# Patient Record
Sex: Male | Born: 1955 | Race: White | Hispanic: Yes | Marital: Single | Smoking: Never smoker
Health system: Southern US, Community
[De-identification: ages and names within clinical notes are randomized; demographics above are authoritative.]

## PROBLEM LIST (undated history)

## (undated) DIAGNOSIS — I10 Essential (primary) hypertension: Secondary | ICD-10-CM

## (undated) HISTORY — DX: Essential (primary) hypertension: I10

---

## 2011-05-08 ENCOUNTER — Ambulatory Visit: Payer: Self-pay | Admitting: Physician Assistant

## 2011-05-08 VITALS — BP 136/82 | HR 79 | Temp 99.0°F | Resp 16 | Ht 71.5 in | Wt 192.0 lb

## 2011-05-08 DIAGNOSIS — R05 Cough: Secondary | ICD-10-CM

## 2011-05-08 DIAGNOSIS — J101 Influenza due to other identified influenza virus with other respiratory manifestations: Secondary | ICD-10-CM

## 2011-05-08 DIAGNOSIS — M791 Myalgia, unspecified site: Secondary | ICD-10-CM

## 2011-05-08 DIAGNOSIS — J029 Acute pharyngitis, unspecified: Secondary | ICD-10-CM

## 2011-05-08 DIAGNOSIS — IMO0001 Reserved for inherently not codable concepts without codable children: Secondary | ICD-10-CM

## 2011-05-08 DIAGNOSIS — R509 Fever, unspecified: Secondary | ICD-10-CM

## 2011-05-08 DIAGNOSIS — J111 Influenza due to unidentified influenza virus with other respiratory manifestations: Secondary | ICD-10-CM

## 2011-05-08 LAB — POCT INFLUENZA A/B: Influenza A, POC: NEGATIVE

## 2011-05-08 MED ORDER — IPRATROPIUM BROMIDE 0.03 % NA SOLN: 2.0000 | Freq: Two times a day (BID) | NASAL | Status: DC

## 2011-05-08 MED ORDER — HYDROCOD POLST-CHLORPHEN POLST 10-8 MG/5ML PO LQCR: 5.0000 mL | Freq: Two times a day (BID) | ORAL | Status: DC | PRN

## 2011-05-08 MED ORDER — OSELTAMIVIR PHOSPHATE 75 MG PO CAPS: 75.0000 mg | ORAL_CAPSULE | Freq: Two times a day (BID) | ORAL | Status: AC

## 2011-05-08 NOTE — Progress Notes (Signed)
  Subjective:    Patient ID: Nathaniel Hart, male    DOB: Jun 14, 1955, 56 y.o.   MRN: [id number]  HPI Presents for 3 days of illness.  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  Achey all over, sorethroat, cough, congestion, fever.   Review of Systems As above.    Objective:   Physical Exam  Vital signs noted. Well-developed, well nourished [place] who is awake, alert and oriented, in NAD. HEENT: [place]/AT, PERRL, EOMI.  Sclera and conjunctiva are clear.  EAC are patent, TMs are normal in appearance. Nasal mucosa is pink and moist. OP is clear. Neck: supple, non-tender, no lymphadenopathy, thyromegaly. Heart: RRR, no murmur Lungs: CTA Abdomen: normo-active bowel sounds, supple, non-tender, no mass or organomegaly. Extremities: no cyanosis, clubbing or edema. Skin: warm and dry without rash.  Results for orders placed in visit on 05/08/11  POCT INFLUENZA A/B      Component Value Range   Influenza A, POC Negative     Influenza B, POC Positive          Assessment & Plan:   1. Influenza B  oseltamivir (TAMIFLU) 75 MG capsule, ipratropium (ATROVENT) 0.03 % nasal spray, chlorpheniramine-HYDROcodone (TUSSIONEX PENNKINETIC ER) 10-8 MG/5ML LQCR  2. Fever  POCT Influenza A/B  3. Muscle pain    4. Cough    5. Acute pharyngitis     Supportive care. Re-evaluate if symptoms worsen/persist.

## 2011-05-08 NOTE — Patient Instructions (Signed)
Get lots of rest.  Drink at least 64 ounces of water daily.  Use acetaminophen and/or ibuprofen as needed for pain and fever.

## 2011-05-08 NOTE — Progress Notes (Signed)
Addended by: Denton Lank on: 05/08/2011 02:02 PM   Modules accepted: Orders

## 2012-06-22 ENCOUNTER — Ambulatory Visit: Payer: PRIVATE HEALTH INSURANCE | Admitting: Sports Medicine

## 2012-07-05 ENCOUNTER — Encounter: Payer: Self-pay | Admitting: Sports Medicine

## 2012-07-05 ENCOUNTER — Ambulatory Visit (INDEPENDENT_AMBULATORY_CARE_PROVIDER_SITE_OTHER): Payer: PRIVATE HEALTH INSURANCE | Admitting: Sports Medicine

## 2012-07-05 VITALS — BP 170/100 | HR 83 | Wt 194.0 lb

## 2012-07-05 DIAGNOSIS — Z299 Encounter for prophylactic measures, unspecified: Secondary | ICD-10-CM | POA: Insufficient documentation

## 2012-07-05 DIAGNOSIS — E785 Hyperlipidemia, unspecified: Secondary | ICD-10-CM

## 2012-07-05 DIAGNOSIS — B351 Tinea unguium: Secondary | ICD-10-CM

## 2012-07-05 DIAGNOSIS — I10 Essential (primary) hypertension: Secondary | ICD-10-CM

## 2012-07-05 DIAGNOSIS — Z23 Encounter for immunization: Secondary | ICD-10-CM

## 2012-07-05 MED ORDER — TERBINAFINE HCL 250 MG PO TABS: 250.0000 mg | ORAL_TABLET | Freq: Every day | ORAL | Status: AC

## 2012-07-05 MED ORDER — LISINOPRIL-HYDROCHLOROTHIAZIDE 20-25 MG PO TABS: 1.0000 | ORAL_TABLET | Freq: Every day | ORAL | Status: DC

## 2012-07-05 NOTE — Patient Instructions (Addendum)
Ringworm, Nail A fungal infection of the nail (tinea unguium/onychomycosis) is common. It is common as the visible part of the nail is composed of dead cells which have no blood supply to help prevent infection. It occurs because fungi are everywhere and will pick any opportunity to grow on any dead material. Because nails are very slow growing they require up to 2 years of treatment with anti-fungal medications. The entire nail back to the base is infected. This includes approximately  of the nail which you cannot see. If your caregiver has prescribed a medication by mouth, take it every day and as directed. No progress will be seen for at least 6 to 9 months. Do not be disappointed! Because fungi live on dead cells with little or no exposure to blood supply, medication delivery to the infection is slow; thus the cure is slow. It is also why you can observe no progress in the first 6 months. The nail becoming cured is the base of the nail, as it has the blood supply. Topical medication such as creams and ointments are usually not effective. Important in successful treatment of nail fungus is closely following the medication regimen that your doctor prescribes. Sometimes you and your caregiver may elect to speed up this process by surgical removal of all the nails. Even this may still require 6 to 9 months of additional oral medications. See your caregiver as directed. Remember there will be no visible improvement for at least 6 months. See your caregiver sooner if other signs of infection (redness and swelling) develop. Document Released: 01/24/2000 Document Revised: 04/20/2011 Document Reviewed: 04/03/2008 [facility] Patient Information 2014 [place], [place]. Tia de las uas (Ringworm, Nail) Usted presenta una infeccin por hongos en las uas de los pies. La parte visible de las uas est formada por clulas muertas que no tienen suministro sanguneo que intervenga en la prevencin de las infecciones. La  infeccin se produce debido a que los hongos estn en todas partes. Aprovecharn cualquier oportunidad para crecer Presenter, broadcasting. Esto incluye los tejidos de su cuerpo formados por [employer].  [place] uas tienen un crecimiento muy lento, requieren [place] 2 aos de tratamiento con medicamentos antimicticos. La infeccin involucra a toda la ua, hasta la base. Incluye aproximadamente 1/3 de la ua que no puede verse. Si el profesional le ha prescrito un medicamento por boca, [employer]. No podr ver ningn progreso hasta que hayan transcurrido entre 6 y 9 meses. No debe preocuparse. La curacin es lenta. Se debe a que el medicamento llega hasta la infeccin de manera muy lenta. Los hongos pueden vivir sobre las clulas muertas con poca o casi ninguna exposicin al suministro de [place]. Esta tambin es la razn por la cual no se observa mejora en los primeros 6 meses. La ua comienza la curacin en la base, donde hay suministro de [place]. La medicacin tpica, como las cremas y los ungentos generalmente no son eficaces. Channing Mutters al profesional podrn elegir acelerar el proceso de curacin con la extraccin quirrgica de todas las uas. An as, Pharmacist, hospital 6 y 9 meses de medicamentos por va oral adicionales. Concurra a la Training and development officer profesional que lo asiste de acuerdo a lo que le haya indicado. Recuerde que no observar mejora durante al menos 6 meses. Consulte antes con el profesional si aparecen otros signos de infeccin (p. ej. enrojecimiento e hinchazn). Document Released: 11/05/2004 Document Revised: 04/20/2011 [facility] Patient Information 2014 [place], [place].

## 2012-07-05 NOTE — Assessment & Plan Note (Signed)
Checking routine blood work. Tdap. GI referral for colonoscopy.

## 2012-07-05 NOTE — Assessment & Plan Note (Signed)
Starting lisinopril/hydrochlorothiazide. Return in 2-3 weeks to recheck.

## 2012-07-05 NOTE — Assessment & Plan Note (Signed)
Lamisil for 3 months. Checking liver function.

## 2012-07-05 NOTE — Progress Notes (Signed)
  Subjective:    CC: Establish care.   HPI:  This very pleasant 57 year old male has no significant past medical history, but seeks to establish care.  Toenail: Present for years, mildly itchy, toenails are somewhat dystrophic. Symptoms are moderate, stable.  Elevated blood pressure: No history of hypertension, no headaches, visual changes, chest pain.  Preventive measure: Due for all preventive measures for a man his age.  Past medical history, Surgical history, Family history not pertinant except as noted below, Social history, Allergies, and medications have been entered into the medical record, reviewed, and no changes needed.   Review of Systems: No headache, visual changes, nausea, vomiting, diarrhea, constipation, dizziness, abdominal pain, skin rash, fevers, chills, night sweats, swollen lymph nodes, weight loss, chest pain, body aches, joint swelling, muscle aches, shortness of breath, mood changes, visual or auditory hallucinations.  Objective:    General: Well Developed, well nourished, and in no acute distress.  Neuro: Alert and oriented x3, extra-ocular muscles intact, sensation grossly intact.  HEENT: Normocephalic, atraumatic, pupils equal round reactive to light, neck supple, no masses, no lymphadenopathy, thyroid nonpalpable.  Skin: Warm and dry, no rashes noted.  Cardiac: Regular rate and rhythm, no murmurs rubs or gallops.  Respiratory: Clear to auscultation bilaterally. Not using accessory muscles, speaking in full sentences.  Abdominal: Soft, nontender, nondistended, positive bowel sounds, no masses, no organomegaly.  Musculoskeletal: Shoulder, elbow, wrist, hip, knee, ankle stable, and with full range of motion.  There is onychomycosis with onychomycosis on the great nails of both feet. Impression and Recommendations:    The patient was counselled, risk factors were discussed, anticipatory guidance given.

## 2012-07-11 LAB — CBC
HCT: 41.9 % (ref 39.0–52.0)
Hemoglobin: 14.2 g/dL (ref 13.0–17.0)
MCH: 30 pg (ref 26.0–34.0)
MCHC: 33.9 g/dL (ref 30.0–36.0)
MCV: 88.4 fL (ref 78.0–100.0)
Platelets: 304 10*3/uL (ref 150–400)
RBC: 4.74 MIL/uL (ref 4.22–5.81)
RDW: 14.7 % (ref 11.5–15.5)
WBC: 8.7 K/uL (ref 4.0–10.5)

## 2012-07-11 LAB — COMPREHENSIVE METABOLIC PANEL
ALT: 21 U/L (ref 0–53)
AST: 19 U/L (ref 0–37)
Albumin: 4.1 g/dL (ref 3.5–5.2)
Alkaline Phosphatase: 58 U/L (ref 39–117)
Glucose, Bld: 102 mg/dL — ABNORMAL HIGH (ref 70–99)
Potassium: 4.2 mEq/L (ref 3.5–5.3)
Sodium: 139 mEq/L (ref 135–145)
Total Protein: 7.6 g/dL (ref 6.0–8.3)

## 2012-07-11 LAB — COMPREHENSIVE METABOLIC PANEL WITH GFR
BUN: 23 mg/dL (ref 6–23)
CO2: 27 meq/L (ref 19–32)
Calcium: 9 mg/dL (ref 8.4–10.5)
Chloride: 101 meq/L (ref 96–112)
Creat: 1.23 mg/dL (ref 0.50–1.35)
Total Bilirubin: 0.4 mg/dL (ref 0.3–1.2)

## 2012-07-11 LAB — HEMOGLOBIN A1C
Hgb A1c MFr Bld: 5.9 % — ABNORMAL HIGH (ref <5.7)
Mean Plasma Glucose: 123 mg/dL — ABNORMAL HIGH (ref <117)

## 2012-07-11 LAB — LIPID PANEL
Cholesterol: 181 mg/dL (ref 0–200)
HDL: 30 mg/dL — ABNORMAL LOW (ref >39)
LDL Cholesterol: 129 mg/dL — ABNORMAL HIGH (ref 0–99)
Total CHOL/HDL Ratio: 6 ratio
Triglycerides: 109 mg/dL (ref <150)
VLDL: 22 mg/dL (ref 0–40)

## 2012-07-11 LAB — TSH: TSH: 1.255 u[IU]/mL (ref 0.350–4.500)

## 2012-07-12 DIAGNOSIS — E785 Hyperlipidemia, unspecified: Secondary | ICD-10-CM | POA: Insufficient documentation

## 2012-07-12 LAB — VITAMIN D 25 HYDROXY (VIT D DEFICIENCY, FRACTURES): Vit D, 25-Hydroxy: 24 ng/mL — ABNORMAL LOW (ref 30–89)

## 2012-07-12 LAB — TESTOSTERONE, FREE, TOTAL, SHBG
Sex Hormone Binding: 24 nmol/L (ref 13–71)
Testosterone, Free: 88.5 pg/mL (ref 47.0–244.0)
Testosterone-% Free: 2.4 % (ref 1.6–2.9)
Testosterone: 370 ng/dL (ref 300–890)

## 2012-07-12 MED ORDER — VITAMIN D (ERGOCALCIFEROL) 1.25 MG (50000 UNIT) PO CAPS: 50000.0000 [IU] | ORAL_CAPSULE | ORAL | Status: DC

## 2012-07-12 NOTE — Addendum Note (Signed)
Addended by: Monica Becton on: 07/12/2012 12:55 PM   Modules accepted: Orders

## 2012-07-14 ENCOUNTER — Encounter: Payer: Self-pay | Admitting: Sports Medicine

## 2012-07-14 ENCOUNTER — Ambulatory Visit (INDEPENDENT_AMBULATORY_CARE_PROVIDER_SITE_OTHER): Payer: PRIVATE HEALTH INSURANCE | Admitting: Sports Medicine

## 2012-07-14 VITALS — BP 120/77 | HR 78

## 2012-07-14 DIAGNOSIS — R7303 Prediabetes: Secondary | ICD-10-CM | POA: Insufficient documentation

## 2012-07-14 DIAGNOSIS — E785 Hyperlipidemia, unspecified: Secondary | ICD-10-CM

## 2012-07-14 DIAGNOSIS — I10 Essential (primary) hypertension: Secondary | ICD-10-CM

## 2012-07-14 DIAGNOSIS — R7309 Other abnormal glucose: Secondary | ICD-10-CM

## 2012-07-14 NOTE — Patient Instructions (Addendum)
Exercise for 30 minutes 3 times per week, you need to get your heart rate up to 130 beats per minute.

## 2012-07-14 NOTE — Assessment & Plan Note (Signed)
We'll work on cutting back carbohydrates, recheck A1c in 3 months.

## 2012-07-14 NOTE — Progress Notes (Signed)
  Subjective:    CC: Followup  HPI: Prediabetes: Hemoglobin A1c was high, 5.9, he admits to eating lots of rice, breads, carbohydrates.  Hypertension: Was greatly elevated the last visit, he was having ringing in his ears and headaches, started lisinopril/hydrochlorothiazide, and it has become beautifully controlled, all symptoms have resolved.  Hyperlipidemia: Mild, desires to do lifestyle modification.  Onychomycosis: No problems with Lamisil.  Past medical history, Surgical history, Family history not pertinant except as noted below, Social history, Allergies, and medications have been entered into the medical record, reviewed, and no changes needed.   Review of Systems: No fevers, chills, night sweats, weight loss, chest pain, or shortness of breath.   Objective:    General: Well Developed, well nourished, and in no acute distress.  Neuro: Alert and oriented x3, extra-ocular muscles intact, sensation grossly intact.  HEENT: Normocephalic, atraumatic, pupils equal round reactive to light, neck supple, no masses, no lymphadenopathy, thyroid nonpalpable.  Skin: Warm and dry, no rashes. Cardiac: Regular rate and rhythm, no murmurs rubs or gallops, no lower extremity edema.  Respiratory: Clear to auscultation bilaterally. Not using accessory muscles, speaking in full sentences. Impression and Recommendations:

## 2012-07-14 NOTE — Assessment & Plan Note (Signed)
Will work on diet and exercise.

## 2012-07-14 NOTE — Assessment & Plan Note (Signed)
Very well controlled 

## 2012-07-21 ENCOUNTER — Ambulatory Visit: Payer: PRIVATE HEALTH INSURANCE | Admitting: Sports Medicine

## 2012-08-23 ENCOUNTER — Telehealth: Payer: Self-pay

## 2012-08-23 NOTE — Telephone Encounter (Signed)
Give it some time to let his body get used to the medicine, drink plenty of water, and let me know some of his blood pressure readings.

## 2012-08-23 NOTE — Telephone Encounter (Signed)
Pt called stated that his blood pressure medication is working good but his energy is down. He wants to know what he can do about it. Rhonda Cunningham,CMA

## 2012-08-24 ENCOUNTER — Telehealth: Payer: Self-pay

## 2012-08-24 NOTE — Telephone Encounter (Signed)
LMOM with instructions on continuing blood pressure medication, and for him to report some of his b/p readings as well. Shelsy Seng,CMA

## 2012-08-24 NOTE — Telephone Encounter (Addendum)
Pt states that he is not having any problems with his medications, He wants to know if you can write him a letter stating that there is no restrictions for him to wear a respirator for his job.  Rhonda Cunningham,CMA

## 2012-08-25 NOTE — Telephone Encounter (Signed)
Done

## 2012-09-27 ENCOUNTER — Telehealth: Payer: Self-pay | Admitting: Sports Medicine

## 2012-09-27 NOTE — Telephone Encounter (Signed)
I spoke to patient advised him that he has 3 Refills on the lisinopril. Also advised patient that he could take his Rx bottle to [facility] and have them to refill it for him. Rhonda Cunningham,CMA

## 2012-09-27 NOTE — Telephone Encounter (Signed)
Pt  Only has 4 pills left on bp meds.  He is working out of town and will not return until Sept 15th.   Please call in script to [facility] in [place], [place]..did not have address nor phone number available

## 2012-09-30 ENCOUNTER — Encounter: Payer: Self-pay | Admitting: Sports Medicine

## 2013-02-07 ENCOUNTER — Encounter: Payer: Self-pay | Admitting: Sports Medicine

## 2013-02-07 ENCOUNTER — Ambulatory Visit (INDEPENDENT_AMBULATORY_CARE_PROVIDER_SITE_OTHER): Payer: PRIVATE HEALTH INSURANCE | Admitting: Sports Medicine

## 2013-02-07 VITALS — BP 131/83 | HR 104 | Wt 193.0 lb

## 2013-02-07 DIAGNOSIS — IMO0002 Reserved for concepts with insufficient information to code with codable children: Secondary | ICD-10-CM | POA: Insufficient documentation

## 2013-02-07 DIAGNOSIS — H8110 Benign paroxysmal vertigo, unspecified ear: Secondary | ICD-10-CM

## 2013-02-07 MED ORDER — DEXAMETHASONE SODIUM PHOSPHATE 4 MG/ML IJ SOLN
4.0000 mg | Freq: Once | INTRAMUSCULAR | Status: AC
  Administered 2013-02-07: 4 mg via INTRAMUSCULAR

## 2013-02-07 MED ORDER — PREDNISONE 50 MG PO TABS: 50.0000 mg | ORAL_TABLET | Freq: Every day | ORAL | Status: DC

## 2013-02-07 MED ORDER — DIAZEPAM 5 MG PO TABS: ORAL_TABLET | ORAL | Status: DC

## 2013-02-07 NOTE — Patient Instructions (Addendum)
Vértigo postural benigno  (Benign Positional Vertigo)   Vértigo es la sensación de que el entorno se mueve estando quieto. Es la forma más frecuente de vértigo. Benigno significa que la causa del trastorno no es grave. Es más frecuente en adultos mayores.  CAUSAS   Es el resultado de un trastorno en el sistema laberíntico. Es una zona en el oído medio que ayuda a controlar el equilibrio. La causa puede ser una infección viral, una lesión en la cabeza o un movimiento repetitivo. Sin embargo, a menudo no se halla causa.   SÍNTOMAS   Los síntomas de vértigo posicional benigno se producen al mover la cabeza o los ojos en diferentes direcciones. Algunos de los síntomas pueden ser:   · Pérdida de equilibrio y caídas.  · Vómitos.  · Visión borrosa.  · Mareos.  · Náuseas.  · Movimientos oculares involuntarios (nistagmus).  DIAGNÓSTICO   El vértigo postural benigno se diagnostica mediante un examen físico. Si la causa específica de su vértigo posicional benigno es desconocido, su médico puede indicar diagnósticos por imágenes, como la resonancia magnética (RM) o la tomografía computada (TC).   TRATAMIENTO   El médico le podrá recomendar movimientos o procedimientos para corregir el vértigo posicional benigno. Para tratar los síntomas pueden indicarse medicamentos como meclizina, benzodiazepinas y medicamentos para las náuseas. En casos raros, si los síntomas son causados   por ciertos trastornos que afectan el oído interno, es posible que necesite cirugía.   INSTRUCCIONES PARA EL CUIDADO EN EL HOGAR   · Siga las indicaciones del médico.  · Muévase lentamente. No haga movimientos bruscos con la cabeza ni el cuerpo.  · Evite conducir vehículos.  · Evite operar maquinarias pesadas.  · Evite realizar tareas que serían peligrosas para usted u otras personas durante un episodio de vértigo.  · Debe ingerir gran cantidad de líquido para mantener la orina de tono claro o color amarillo pálido.  SOLICITE ATENCIÓN MÉDICA DE INMEDIATO  SI:   · Tiene dificultad para hablar, caminar, siente debilidad o tiene problemas para usar los brazos, las manos o las piernas.  · Tiene dificultad para respirar.  · Sufre un dolor de cabeza intenso.  · Las náuseas o los vómitos persisten o empeoran.  · Tiene cambios en la visión.  · Sus familiares o amigos notan cambios en su conducta.  · El dolor empeora.  · Tiene fiebre.  · Comienza a sentir rigidez en el cuello o sensibilidad a la luz.  ASEGÚRESE DE QUE:   · Comprende estas instrucciones.  · Controlará su enfermedad.  · Solicitará ayuda de inmediato si no mejora o si empeora.  Document Released: 05/14/2008 Document Revised: 04/20/2011  ExitCare® Patient Information ©2014 [employer].

## 2013-02-07 NOTE — Progress Notes (Signed)
  Subjective:    CC: Dizzy  HPI: This is a very pleasant 57 year old male, for the past week he has had dizziness that he describes as the room spinning, and it is particularly bad when he turns or moves his head. He has also an occasional minimal ringing in his right ear. He went to urgent care in [place] where he was prescribed Antivert and Phenergan which was helpful unfortunately symptoms have persisted. He denies any headaches, visual changes with the exception of dizziness, no numbness or tingling on either side of the body and no slurred speech. He has not vomited but he does have some nausea. Symptoms are moderate, persistent. He does have to drive to [place] next week for work and wonders what can be done. No fevers, chills.  Past medical history, Surgical history, Family history not pertinant except as noted below, Social history, Allergies, and medications have been entered into the medical record, reviewed, and no changes needed.   Review of Systems: No fevers, chills, night sweats, weight loss, chest pain, or shortness of breath.   Objective:    General: Well Developed, well nourished, and in no acute distress.  Neuro: Alert and oriented x3, extra-ocular muscles intact, sensation grossly intact.  HEENT: Normocephalic, atraumatic, pupils equal round reactive to light, neck supple, no masses, no lymphadenopathy, thyroid nonpalpable. There is visible horizontal nystagmus, he also has a positive Dix-Hallpike maneuver. Oropharynx, nasopharynx, external ear canals are unremarkable. Skin: Warm and dry, no rashes. Cardiac: Regular rate and rhythm, no murmurs rubs or gallops, no lower extremity edema.  Respiratory: Clear to auscultation bilaterally. Not using accessory muscles, speaking in full sentences.  Impression and Recommendations:

## 2013-02-07 NOTE — Assessment & Plan Note (Addendum)
Exam is benign with the exception of horizontal nystagmus, neuro exam is normal. He should continue Antivert, I am going to add a shot of Decadron, a prednisone burst, and Valium. He should also hold off on his blood pressure medicine for now. He is also going to need an out of work note, as he has to be in [place] for work next week, I do not think he is safe to drive. He did have some mild ringing in the right ear, if no better next week I am going to send him to ENT.

## 2013-02-14 ENCOUNTER — Ambulatory Visit: Payer: Self-pay | Admitting: Sports Medicine

## 2013-03-10 ENCOUNTER — Telehealth: Payer: Self-pay

## 2013-03-10 MED ORDER — MECLIZINE HCL 25 MG PO TABS: 25.0000 mg | ORAL_TABLET | Freq: Three times a day (TID) | ORAL | Status: DC | PRN

## 2013-03-10 NOTE — Telephone Encounter (Signed)
Refill called, in, how is he feeling?

## 2013-03-10 NOTE — Telephone Encounter (Signed)
Patient called requesting a [facility] [place]. Rhonda Cunningham,CMA

## 2013-03-10 NOTE — Telephone Encounter (Signed)
Patient stated that he was in some pain but he is out of town working. Tamra Koos,CMA

## 2013-03-17 ENCOUNTER — Ambulatory Visit (INDEPENDENT_AMBULATORY_CARE_PROVIDER_SITE_OTHER): Payer: No Typology Code available for payment source | Admitting: Sports Medicine

## 2013-03-17 ENCOUNTER — Encounter: Payer: Self-pay | Admitting: Sports Medicine

## 2013-03-17 VITALS — BP 152/88 | HR 95 | Wt 194.0 lb

## 2013-03-17 DIAGNOSIS — H8110 Benign paroxysmal vertigo, unspecified ear: Secondary | ICD-10-CM

## 2013-03-17 DIAGNOSIS — IMO0002 Reserved for concepts with insufficient information to code with codable children: Secondary | ICD-10-CM

## 2013-03-17 DIAGNOSIS — I10 Essential (primary) hypertension: Secondary | ICD-10-CM

## 2013-03-17 DIAGNOSIS — M5412 Radiculopathy, cervical region: Secondary | ICD-10-CM | POA: Insufficient documentation

## 2013-03-17 MED ORDER — CYCLOBENZAPRINE HCL 10 MG PO TABS: ORAL_TABLET | ORAL | Status: DC

## 2013-03-17 MED ORDER — METHYLPREDNISOLONE ACETATE 40 MG/ML IJ SUSP
40.0000 mg | Freq: Once | INTRAMUSCULAR | Status: AC
  Administered 2013-03-17: 40 mg via INTRAMUSCULAR

## 2013-03-17 MED ORDER — METHYLPREDNISOLONE SODIUM SUCC 125 MG IJ SOLR
125.0000 mg | Freq: Once | INTRAMUSCULAR | Status: AC
  Administered 2013-03-17: 125 mg via INTRAMUSCULAR

## 2013-03-17 MED ORDER — MELOXICAM 15 MG PO TABS: ORAL_TABLET | ORAL | Status: DC

## 2013-03-17 NOTE — Assessment & Plan Note (Signed)
Resolved with prednisone and Valium. Currently doing well with occasional meclizine.

## 2013-03-17 NOTE — Assessment & Plan Note (Signed)
Symptoms are referable to the left C7 nerve root. He does have a positive Spurling's maneuver. Depo-Medrol 40 and Solu-Medrol 125 intramuscular. Mobic, Flexeril at bedtime, formal physical therapy, patient plans [place] do physical therapy in [place], x-rays. Return to see me in one month, MRI for interventional injection planning if no better.

## 2013-03-17 NOTE — Progress Notes (Signed)
  Subjective:    CC: Neck pain  HPI: Benign positional vertigo: Resolved and controlled with meclizine for mild recurrences.  Hypertension: Did not take blood pressure medicine this morning. No headaches, visual changes, focal neurologic deficits. No chest pain.  Neck pain: present for several weeks, moderate, persistent, when he turns his head to the left he gets radiation down the left arm in a C7 distribution. No trauma, no bowel or bladder dysfunction.  Past medical history, Surgical history, Family history not pertinant except as noted below, Social history, Allergies, and medications have been entered into the medical record, reviewed, and no changes needed.   Review of Systems: No fevers, chills, night sweats, weight loss, chest pain, or shortness of breath.   Objective:    General: Well Developed, well nourished, and in no acute distress.  Neuro: Alert and oriented x3, extra-ocular muscles intact, sensation grossly intact.  HEENT: Normocephalic, atraumatic, pupils equal round reactive to light, neck supple, no masses, no lymphadenopathy, thyroid nonpalpable.  Skin: Warm and dry, no rashes. Cardiac: Regular rate and rhythm, no murmurs rubs or gallops, no lower extremity edema.  Respiratory: Clear to auscultation bilaterally. Not using accessory muscles, speaking in full sentences. Neck: Inspection unremarkable. No palpable stepoffs. Positive Spurling's maneuver. Full neck range of motion Grip strength and sensation normal in bilateral hands Strength good C4 to T1 distribution No sensory change to C4 to T1 Negative Hoffman sign bilaterally Reflexes normal  Impression and Recommendations:

## 2013-03-17 NOTE — Assessment & Plan Note (Signed)
Elevated but did not take his blood pressure medicine today.

## 2013-03-17 NOTE — Patient Instructions (Signed)
Radiculopatía cervical °(Cervical Radiculopathy) ° La radiculopatía cervical se produce cuando un nervio del cuello se comprime o es desplazado un disco herniado o por cambios artríticos en los huesos de la columna cervical. Esto puede ocurrir debido a una lesión o como parte del proceso normal de envejecimiento. La presión sobre los nervios cervicales pueden causar dolor o adormecimiento que se extiende desde el cuello hacia los brazos y los dedos.  °CAUSAS  °Hay numerosas causas que originan este problema, entre las que se incluyen:  °· Traumatismos. °· Rigidez de los músculos del cuello por el uso excesivo. °· Articulaciones que duelen y que se hinchan (artritis). °· Desgaste o degeneración de los huesos y las articulaciones de la columna (espondilosis) debido al proceso de envejecimiento. °· Espolones óseos que pueden desarrollarse cerca de los nervios cervicales. °SÍNTOMAS  °Los síntomas son dolor, debilidad o adormecimiento en la mano y en el brazo afectados. El dolor puede ser intenso o irritante. Los síntomas pueden empeorar al extender o torcer el cuello.  °DIAGNÓSTICO  °El médico le preguntará acerca de sus síntomas y le hará un examen físico. Tratará de poner a prueba su fuerza y   sus reflejos. Le indicarán radiografías, una tomografía computada y resonancia magnética en caso de traumatismos o si los síntomas no desaparecen después de cierto período de tiempo. Podrán hacerle una electromiografía (EMG) o una prueba de conducción nerviosa para estudiar el funcionamiento de sus nervios y músculos.  °TRATAMIENTO  °El médico podrá recomendar algunos ejercicios para calmar los síntomas. La radiculopatía puede, y con frecuencia se logra, mejorar con el tiempo y un tratamiento. Si los síntomas continúan, las opciones de tratamiento son: °· Usar un collar blando durante un tiempo breve. °· Fisioterapia para fortalecer los músculos del cuello. °· Medicamentos, como los antinflamatorios no esteroideos (AINES),  corticoides por vía oral o inyecciones en la columna vertebral. °· Cirugía. Según la causa del problema podrán implementarse diferentes tipos de cirugía. °INSTRUCCIONES PARA EL CUIDADO DOMICILIARIO °· Aplique hielo sobre la zona afectada. °· Ponga el hielo en una bolsa plástica. °· Colóquese una toalla entre la piel y la bolsa de hielo. °· Deje el hielo durante 15 a 20 minutos 3 a 4 veces por día, o según las indicaciones del médico. °· Si el hielo no ayuda, puede intentar con calor. Tome una ducha o baño caliente, o use una bolsa de agua caliente según las indicaciones de su médico. °· Puede intentar con un masaje suave en el cuello y los hombros. °· Por la noche duerma con una almohada plana. °· Utilice los medicamentos de venta libre o de prescripción para el dolor, el malestar o la fiebre, según se lo indique el profesional que lo asiste. °· Si le indican fisioterapia, siga las indicaciones de su médico. °· Si le indican un collar blando, úselo según las indicaciones. °SOLICITE ATENCIÓN MÉDICA DE INMEDIATO SI: °· El dolor empeora mucho y no puede controlarlo con medicamentos. °· Siente debilidad o adormecimiento en la mano, el brazo, el rostro o la pierna. °· Le sube la fiebre o tiene el cuello rígido. °· Pierde el control del intestino o de la vejiga (incontinencia). °· Tiene dificultad para caminar, para mantener el equilibrio o para hablar. °ESTÉ SEGURO QUE:  °· Comprende estas instrucciones. °· Controlará su enfermedad. °· Solicitará ayuda de inmediato si no mejora o si empeora. °Document Released: 11/05/2004 Document Revised: 04/20/2011 °ExitCare® Patient Information ©2014 [employer]. ° °

## 2013-05-11 ENCOUNTER — Other Ambulatory Visit: Payer: Self-pay | Admitting: Sports Medicine

## 2013-05-11 ENCOUNTER — Other Ambulatory Visit: Payer: Self-pay

## 2013-05-11 DIAGNOSIS — I10 Essential (primary) hypertension: Secondary | ICD-10-CM

## 2013-05-11 MED ORDER — LISINOPRIL-HYDROCHLOROTHIAZIDE 20-25 MG PO TABS: 1.0000 | ORAL_TABLET | Freq: Every day | ORAL | Status: DC

## 2013-11-03 ENCOUNTER — Other Ambulatory Visit: Payer: Self-pay | Admitting: Sports Medicine

## 2013-11-03 DIAGNOSIS — I10 Essential (primary) hypertension: Secondary | ICD-10-CM

## 2013-11-03 MED ORDER — LISINOPRIL-HYDROCHLOROTHIAZIDE 20-25 MG PO TABS: 1.0000 | ORAL_TABLET | Freq: Every day | ORAL | Status: DC

## 2013-11-03 MED ORDER — AMLODIPINE BESYLATE 5 MG PO TABS: 5.0000 mg | ORAL_TABLET | Freq: Every day | ORAL | Status: DC

## 2013-11-03 NOTE — Assessment & Plan Note (Signed)
Blood pressure was well controlled but having some erectile dysfunction and lisinopril/hydrochlorothiazide. Switching to amlodipine. Return in one month.

## 2014-06-28 ENCOUNTER — Other Ambulatory Visit: Payer: Self-pay | Admitting: Sports Medicine

## 2014-06-28 ENCOUNTER — Other Ambulatory Visit: Payer: Self-pay | Admitting: *Deleted

## 2014-06-28 DIAGNOSIS — M5412 Radiculopathy, cervical region: Secondary | ICD-10-CM

## 2014-06-28 MED ORDER — MELOXICAM 15 MG PO TABS: ORAL_TABLET | ORAL | Status: DC

## 2016-05-12 ENCOUNTER — Encounter (HOSPITAL_COMMUNITY): Payer: Self-pay | Admitting: Emergency Medicine

## 2016-05-12 DIAGNOSIS — Z79899 Other long term (current) drug therapy: Secondary | ICD-10-CM | POA: Insufficient documentation

## 2016-05-12 DIAGNOSIS — F17200 Nicotine dependence, unspecified, uncomplicated: Secondary | ICD-10-CM | POA: Insufficient documentation

## 2016-05-12 DIAGNOSIS — R51 Headache: Secondary | ICD-10-CM | POA: Insufficient documentation

## 2016-05-12 DIAGNOSIS — Z5321 Procedure and treatment not carried out due to patient leaving prior to being seen by health care provider: Secondary | ICD-10-CM | POA: Insufficient documentation

## 2016-05-12 DIAGNOSIS — I10 Essential (primary) hypertension: Secondary | ICD-10-CM | POA: Insufficient documentation

## 2016-05-12 LAB — BASIC METABOLIC PANEL
Anion gap: 7 (ref 5–15)
BUN: 15 mg/dL (ref 6–20)
CALCIUM: 8.8 mg/dL — AB (ref 8.9–10.3)
CO2: 26 mmol/L (ref 22–32)
CREATININE: 1.23 mg/dL (ref 0.61–1.24)
Chloride: 107 mmol/L (ref 101–111)
GLUCOSE: 121 mg/dL — AB (ref 65–99)
Potassium: 3.7 mmol/L (ref 3.5–5.1)
Sodium: 140 mmol/L (ref 135–145)

## 2016-05-12 LAB — URINALYSIS, ROUTINE W REFLEX MICROSCOPIC
BILIRUBIN URINE: NEGATIVE
Glucose, UA: NEGATIVE mg/dL
HGB URINE DIPSTICK: NEGATIVE
Ketones, ur: NEGATIVE mg/dL
Leukocytes, UA: NEGATIVE
NITRITE: NEGATIVE
PROTEIN: NEGATIVE mg/dL
Specific Gravity, Urine: 1.014 (ref 1.005–1.030)
pH: 6 (ref 5.0–8.0)

## 2016-05-12 LAB — CBC
HCT: 40.2 % (ref 39.0–52.0)
Hemoglobin: 13.5 g/dL (ref 13.0–17.0)
MCH: 30.5 pg (ref 26.0–34.0)
MCHC: 33.6 g/dL (ref 30.0–36.0)
MCV: 91 fL (ref 78.0–100.0)
PLATELETS: 330 10*3/uL (ref 150–400)
RBC: 4.42 MIL/uL (ref 4.22–5.81)
RDW: 14.5 % (ref 11.5–15.5)
WBC: 8.6 10*3/uL (ref 4.0–10.5)

## 2016-05-12 NOTE — ED Triage Notes (Signed)
Patient involved in argument over the phone with family member.  Patient was initially hypertensive, 250/140 with EMS and felt presyncopal.  Patient now with BP of 172/98, still with headache.  Patient is non compliant with HTN meds.

## 2016-05-13 ENCOUNTER — Emergency Department (HOSPITAL_COMMUNITY)
Admission: EM | Admit: 2016-05-13 | Discharge: 2016-05-13 | Disposition: A | Discharge status: Home / Self Care | Payer: No Typology Code available for payment source | Attending: Emergency Medicine | Admitting: Emergency Medicine

## 2016-05-13 NOTE — ED Notes (Signed)
Pt called for in waiting area for vital signs reassessment. No answer x3

## 2016-08-04 ENCOUNTER — Ambulatory Visit: Payer: Self-pay | Admitting: Family Medicine

## 2019-03-24 ENCOUNTER — Other Ambulatory Visit: Payer: Self-pay | Admitting: Physician Assistant

## 2019-03-24 DIAGNOSIS — N1831 Chronic kidney disease, stage 3a: Secondary | ICD-10-CM

## 2020-01-06 ENCOUNTER — Emergency Department (HOSPITAL_BASED_OUTPATIENT_CLINIC_OR_DEPARTMENT_OTHER)
Admission: EM | Admit: 2020-01-06 | Discharge: 2020-01-06 | Disposition: A | Discharge status: Home / Self Care | Payer: BC Managed Care – PPO | Attending: Emergency Medicine | Admitting: Emergency Medicine

## 2020-01-06 ENCOUNTER — Other Ambulatory Visit: Payer: Self-pay

## 2020-01-06 DIAGNOSIS — A09 Infectious gastroenteritis and colitis, unspecified: Secondary | ICD-10-CM | POA: Diagnosis not present

## 2020-01-06 DIAGNOSIS — Z79899 Other long term (current) drug therapy: Secondary | ICD-10-CM | POA: Insufficient documentation

## 2020-01-06 DIAGNOSIS — I10 Essential (primary) hypertension: Secondary | ICD-10-CM | POA: Diagnosis not present

## 2020-01-06 DIAGNOSIS — R197 Diarrhea, unspecified: Secondary | ICD-10-CM

## 2020-01-06 LAB — COMPREHENSIVE METABOLIC PANEL
ALT: 41 U/L (ref 0–44)
AST: 40 U/L (ref 15–41)
Albumin: 4 g/dL (ref 3.5–5.0)
Alkaline Phosphatase: 45 U/L (ref 38–126)
Anion gap: 10 (ref 5–15)
BUN: 26 mg/dL — ABNORMAL HIGH (ref 8–23)
CO2: 17 mmol/L — ABNORMAL LOW (ref 22–32)
Calcium: 8.1 mg/dL — ABNORMAL LOW (ref 8.9–10.3)
Chloride: 109 mmol/L (ref 98–111)
Creatinine, Ser: 1.43 mg/dL — ABNORMAL HIGH (ref 0.61–1.24)
GFR, Estimated: 55 mL/min — ABNORMAL LOW (ref >60)
Glucose, Bld: 120 mg/dL — ABNORMAL HIGH (ref 70–99)
Potassium: 3.8 mmol/L (ref 3.5–5.1)
Sodium: 136 mmol/L (ref 135–145)
Total Bilirubin: 0.5 mg/dL (ref 0.3–1.2)
Total Protein: 8.2 g/dL — ABNORMAL HIGH (ref 6.5–8.1)

## 2020-01-06 LAB — CBC
HCT: 43.4 % (ref 39.0–52.0)
Hemoglobin: 14.6 g/dL (ref 13.0–17.0)
MCH: 30.4 pg (ref 26.0–34.0)
MCHC: 33.6 g/dL (ref 30.0–36.0)
MCV: 90.2 fL (ref 80.0–100.0)
Platelets: 305 10*3/uL (ref 150–400)
RBC: 4.81 MIL/uL (ref 4.22–5.81)
RDW: 14.8 % (ref 11.5–15.5)
WBC: 7.1 10*3/uL (ref 4.0–10.5)
nRBC: 0 % (ref 0.0–0.2)

## 2020-01-06 LAB — LIPASE, BLOOD: Lipase: 50 U/L (ref 11–51)

## 2020-01-06 MED ORDER — PROBIOTIC (LACTOBACILLUS) PO CAPS: 1.0000 | ORAL_CAPSULE | Freq: Three times a day (TID) | ORAL | 0 refills | Status: AC

## 2020-01-06 NOTE — ED Triage Notes (Addendum)
Pt reports diarrhea since Wednesday. Denies fever. States he has been taking imodium and kaopectate without relief. Reports 6 episodes today. Denies pain

## 2020-01-06 NOTE — ED Notes (Signed)
ED Provider at bedside. 

## 2020-01-06 NOTE — ED Provider Notes (Signed)
[facility] EMERGENCY DEPARTMENT Provider Note   CSN: [id number] Arrival date & time: 01/06/20  1930     History Chief Complaint  Patient presents with  . Diarrhea    Nathaniel Hart is a 64 y.o. male.  64 year old male with past medical history below including hypertension, hyperlipidemia who presents with diarrhea.  3 days ago, he began having nonbloody diarrhea that has persisted since it began.  He denies any associated abdominal pain, vomiting, or urinary symptoms.  He has been eating and drinking normally.  He denies any URI symptoms, sick contacts, recent travel, or new medications recently.  He has taken Imodium and Kaopectate without relief.  The history is provided by the patient.  Diarrhea      Past Medical History:  Diagnosis Date  . Hypertension     Patient Active Problem List   Diagnosis Date Noted  . Radiculitis of left cervical region 03/17/2013  . Positional vertigo 02/07/2013  . Prediabetes 07/14/2012  . Hyperlipidemia 07/12/2012  . Essential hypertension, benign 07/05/2012  . Preventive measure 07/05/2012  . Onychomycosis of toenail 07/05/2012    No past surgical history on file.     Family History  Problem Relation Age of Onset  . Hypertension Mother   . Diabetes Sister     Social History   Tobacco Use  . Smoking status: Never Smoker  . Smokeless tobacco: Never Used  Substance Use Topics  . Alcohol use: No  . Drug use: No    Home Medications Prior to Admission medications   Medication Sig Start Date End Date Taking? Authorizing Provider  amLODipine (NORVASC) 5 MG tablet Take 1 tablet (5 mg total) by mouth daily. 11/03/13   Monica Becton, MD  cyclobenzaprine (FLEXERIL) 10 MG tablet One half tab PO qHS, then increase gradually to one tab TID. 03/17/13   Monica Becton, MD  diazepam (VALIUM) 5 MG tablet 1-2 tablets every 8 hours as needed for vertigo/dizziness. 02/07/13   Monica Becton, MD    meclizine (ANTIVERT) 25 MG tablet Take 1 tablet (25 mg total) by mouth 3 (three) times daily as needed for dizziness or nausea. 03/10/13   Monica Becton, MD  meloxicam (MOBIC) 15 MG tablet One tab daily as needed for pain. 06/28/14   Monica Becton, MD  Probiotic, Lactobacillus, CAPS Take 1 capsule by mouth with breakfast, with lunch, and with evening meal for 7 days. 01/06/20 01/13/20  Fredi Geiler, Ambrose Finland, MD  Vitamin D, Ergocalciferol, (DRISDOL) 50000 UNITS CAPS Take 1 capsule (50,000 Units total) by mouth every 7 (seven) days. 07/12/12   Monica Becton, MD    Allergies    Patient has no known allergies.  Review of Systems   Review of Systems  Gastrointestinal: Positive for diarrhea.   All other systems reviewed and are negative except that which was mentioned in HPI  Physical Exam Updated Vital Signs BP (!) 152/99   Pulse 70   Temp 97.9 F (36.6 C) (Oral)   Resp 16   Ht 5\' 11"  (1.803 m)   Wt 87.1 kg   SpO2 97%   BMI 26.78 kg/m   Physical Exam Vitals and nursing note reviewed.  Constitutional:      General: He is not in acute distress.    Appearance: He is well-developed.  HENT:     Head: Normocephalic and atraumatic.     Mouth/Throat:     Mouth: Mucous membranes are moist.     Pharynx: Oropharynx  is clear.  Eyes:     Conjunctiva/sclera: Conjunctivae normal.  Cardiovascular:     Rate and Rhythm: Normal rate and regular rhythm.     Heart sounds: Normal heart sounds. No murmur heard.   Pulmonary:     Effort: Pulmonary effort is normal.     Breath sounds: Normal breath sounds.  Abdominal:     General: Bowel sounds are normal. There is no distension.     Palpations: Abdomen is soft.     Tenderness: There is no abdominal tenderness.  Musculoskeletal:     Cervical back: Neck supple.     Right lower leg: No edema.     Left lower leg: No edema.  Skin:    General: Skin is warm and dry.  Neurological:     Mental Status: He is alert and  oriented to person, place, and time.     Comments: Fluent speech  Psychiatric:        Judgment: Judgment normal.     ED Results / Procedures / Treatments   Labs (all labs ordered are listed, but only abnormal results are displayed) Labs Reviewed  COMPREHENSIVE METABOLIC PANEL - Abnormal; Notable for the following components:      Result Value   CO2 17 (*)    Glucose, Bld 120 (*)    BUN 26 (*)    Creatinine, Ser 1.43 (*)    Calcium 8.1 (*)    Total Protein 8.2 (*)    GFR, Estimated 55 (*)    All other components within normal limits  LIPASE, BLOOD  CBC  URINALYSIS, ROUTINE W REFLEX MICROSCOPIC    EKG None  Radiology No results found.  Procedures Procedures (including critical care time)  Medications Ordered in ED Medications - No data to display  ED Course  I have reviewed the triage vital signs and the nursing notes.  Pertinent labs  that were available during my care of the patient were reviewed by me and considered in my medical decision making (see chart for details).    MDM Rules/Calculators/A&P                          Well-appearing on exam, afebrile, abdomen soft and nontender.  Denying any pain.  It sounds like he has been eating regular food throughout his diarrheal illness and I counseled him on cutting back to liquids alone until symptoms improve and gradually progressing to regular food.  Recommended starting probiotics to improve diarrhea symptoms.  Because he is only 3 days into his symptoms, I do not feel he needs stool studies at this time but I have advised he should follow-up with PCP if symptoms are persistent at 1 week to send stool studies at that time.  His lab work today shows normal LFTs and lipase, normal CBC.  He has mild dehydration with CO2 17, BUN 26, creatinine 1.43.  He is tolerating p.o. and has no problems drinking therefore encouraged aggressive hydration at home.  I have reviewed return precautions with patient and his wife and they  voiced understanding. Final Clinical Impression(s) / ED Diagnoses Final diagnoses:  Diarrhea of presumed infectious origin    Rx / DC Orders ED Discharge Orders         Ordered    Probiotic, Lactobacillus, CAPS  3 times daily with meals        01/06/20 2302           Mikhael Hendriks, Ambrose Finland, MD 01/06/20  2311  

## 2020-05-10 ENCOUNTER — Observation Stay (HOSPITAL_BASED_OUTPATIENT_CLINIC_OR_DEPARTMENT_OTHER)
Admission: EM | Admit: 2020-05-10 | Discharge: 2020-05-11 | Disposition: A | Discharge status: Home / Self Care | Payer: BC Managed Care – PPO | Attending: Internal Medicine | Admitting: Internal Medicine

## 2020-05-10 ENCOUNTER — Other Ambulatory Visit: Payer: Self-pay

## 2020-05-10 ENCOUNTER — Emergency Department (HOSPITAL_BASED_OUTPATIENT_CLINIC_OR_DEPARTMENT_OTHER): Payer: BC Managed Care – PPO

## 2020-05-10 DIAGNOSIS — N1831 Chronic kidney disease, stage 3a: Secondary | ICD-10-CM | POA: Insufficient documentation

## 2020-05-10 DIAGNOSIS — I129 Hypertensive chronic kidney disease with stage 1 through stage 4 chronic kidney disease, or unspecified chronic kidney disease: Secondary | ICD-10-CM | POA: Insufficient documentation

## 2020-05-10 DIAGNOSIS — N183 Chronic kidney disease, stage 3 unspecified: Secondary | ICD-10-CM | POA: Diagnosis present

## 2020-05-10 DIAGNOSIS — R7989 Other specified abnormal findings of blood chemistry: Secondary | ICD-10-CM

## 2020-05-10 DIAGNOSIS — I10 Essential (primary) hypertension: Secondary | ICD-10-CM | POA: Diagnosis not present

## 2020-05-10 DIAGNOSIS — R791 Abnormal coagulation profile: Secondary | ICD-10-CM | POA: Insufficient documentation

## 2020-05-10 DIAGNOSIS — Z20822 Contact with and (suspected) exposure to covid-19: Secondary | ICD-10-CM | POA: Insufficient documentation

## 2020-05-10 DIAGNOSIS — R55 Syncope and collapse: Principal | ICD-10-CM | POA: Diagnosis present

## 2020-05-10 DIAGNOSIS — Z79899 Other long term (current) drug therapy: Secondary | ICD-10-CM | POA: Diagnosis not present

## 2020-05-10 DIAGNOSIS — D72829 Elevated white blood cell count, unspecified: Secondary | ICD-10-CM | POA: Diagnosis not present

## 2020-05-10 LAB — URINALYSIS, ROUTINE W REFLEX MICROSCOPIC
Bilirubin Urine: NEGATIVE
Glucose, UA: NEGATIVE mg/dL
Hgb urine dipstick: NEGATIVE
Ketones, ur: NEGATIVE mg/dL
Leukocytes,Ua: NEGATIVE
Nitrite: NEGATIVE
Protein, ur: NEGATIVE mg/dL
Specific Gravity, Urine: 1.025 (ref 1.005–1.030)
pH: 5.5 (ref 5.0–8.0)

## 2020-05-10 LAB — BASIC METABOLIC PANEL
Anion gap: 10 (ref 5–15)
BUN: 29 mg/dL — ABNORMAL HIGH (ref 8–23)
CO2: 25 mmol/L (ref 22–32)
Calcium: 8.7 mg/dL — ABNORMAL LOW (ref 8.9–10.3)
Chloride: 101 mmol/L (ref 98–111)
Creatinine, Ser: 1.49 mg/dL — ABNORMAL HIGH (ref 0.61–1.24)
GFR, Estimated: 52 mL/min — ABNORMAL LOW (ref >60)
Glucose, Bld: 153 mg/dL — ABNORMAL HIGH (ref 70–99)
Potassium: 3.6 mmol/L (ref 3.5–5.1)
Sodium: 136 mmol/L (ref 135–145)

## 2020-05-10 LAB — CBC WITH DIFFERENTIAL/PLATELET
Abs Immature Granulocytes: 0.08 10*3/uL — ABNORMAL HIGH (ref 0.00–0.07)
Basophils Absolute: 0.1 10*3/uL (ref 0.0–0.1)
Basophils Relative: 0 %
Eosinophils Absolute: 0.1 10*3/uL (ref 0.0–0.5)
Eosinophils Relative: 1 %
HCT: 42.2 % (ref 39.0–52.0)
Hemoglobin: 14.5 g/dL (ref 13.0–17.0)
Immature Granulocytes: 1 %
Lymphocytes Relative: 11 %
Lymphs Abs: 1.8 10*3/uL (ref 0.7–4.0)
MCH: 31.1 pg (ref 26.0–34.0)
MCHC: 34.4 g/dL (ref 30.0–36.0)
MCV: 90.6 fL (ref 80.0–100.0)
Monocytes Absolute: 1 10*3/uL (ref 0.1–1.0)
Monocytes Relative: 6 %
Neutro Abs: 13.1 10*3/uL — ABNORMAL HIGH (ref 1.7–7.7)
Neutrophils Relative %: 81 %
Platelets: 312 10*3/uL (ref 150–400)
RBC: 4.66 MIL/uL (ref 4.22–5.81)
RDW: 15 % (ref 11.5–15.5)
WBC: 16.1 10*3/uL — ABNORMAL HIGH (ref 4.0–10.5)
nRBC: 0 % (ref 0.0–0.2)

## 2020-05-10 LAB — RESP PANEL BY RT-PCR (FLU A&B, COVID) ARPGX2
Influenza A by PCR: NEGATIVE
Influenza B by PCR: NEGATIVE
SARS Coronavirus 2 by RT PCR: NEGATIVE

## 2020-05-10 LAB — TROPONIN I (HIGH SENSITIVITY): Troponin I (High Sensitivity): 4 ng/L (ref <18)

## 2020-05-10 LAB — CBG MONITORING, ED: Glucose-Capillary: 147 mg/dL — ABNORMAL HIGH (ref 70–99)

## 2020-05-10 LAB — BRAIN NATRIURETIC PEPTIDE: B Natriuretic Peptide: 9.3 pg/mL (ref 0.0–100.0)

## 2020-05-10 LAB — D-DIMER, QUANTITATIVE: D-Dimer, Quant: 0.84 ug/mL-FEU — ABNORMAL HIGH (ref 0.00–0.50)

## 2020-05-10 MED ORDER — SODIUM CHLORIDE 0.9 % IV SOLN: INTRAVENOUS | Status: DC

## 2020-05-10 MED ORDER — ACETAMINOPHEN 325 MG PO TABS: 650.0000 mg | ORAL_TABLET | Freq: Four times a day (QID) | ORAL | Status: DC | PRN

## 2020-05-10 MED ORDER — ENOXAPARIN SODIUM 100 MG/ML ~~LOC~~ SOLN
85.0000 mg | Freq: Two times a day (BID) | SUBCUTANEOUS | Status: DC
  Administered 2020-05-10 – 2020-05-11 (×2): 85 mg via SUBCUTANEOUS
  Filled 2020-05-10 (×2): qty 1

## 2020-05-10 MED ORDER — SODIUM CHLORIDE 0.9% FLUSH
3.0000 mL | Freq: Two times a day (BID) | INTRAVENOUS | Status: DC
  Administered 2020-05-10 – 2020-05-11 (×2): 3 mL via INTRAVENOUS

## 2020-05-10 MED ORDER — ACETAMINOPHEN 650 MG RE SUPP: 650.0000 mg | Freq: Four times a day (QID) | RECTAL | Status: DC | PRN

## 2020-05-10 MED ORDER — ENOXAPARIN SODIUM 40 MG/0.4ML ~~LOC~~ SOLN: 40.0000 mg | SUBCUTANEOUS | Status: DC

## 2020-05-10 MED ORDER — ONDANSETRON HCL 4 MG PO TABS: 4.0000 mg | ORAL_TABLET | Freq: Four times a day (QID) | ORAL | Status: DC | PRN

## 2020-05-10 MED ORDER — ONDANSETRON HCL 4 MG/2ML IJ SOLN: 4.0000 mg | Freq: Four times a day (QID) | INTRAMUSCULAR | Status: DC | PRN

## 2020-05-10 NOTE — ED Notes (Addendum)
Nathaniel Hart - spouse [phone] Spouse given coffee Asked about leaving and was advised that there was no on and out.

## 2020-05-10 NOTE — ED Notes (Signed)
Pt spouse Flo - left the ED. Was advised about visitor policy

## 2020-05-10 NOTE — Plan of Care (Signed)
Transfer from [facility] Mr. Depaul is a 65 y/o male with pmh HTN and HLD who presented after having syncopal episode at work.  Patient felt lightheaded prior to sitting down.  Coworkers nearby witness patient lose consciousness, turned blue, and have incontinence of his bowels prior to regaining consciousness.  Labs significant for WBC 16.1, BUN 29, creatinine 1.49, and initial troponin negative..  Chest x-ray negative.  Question possibility of cardiac cause.  Requested ED provider to check D-dimer, but reported there CT scanner is down.  Started IV fluids at 75 mL/h as patient appears to have possible acute kidney injury.  Transferring to a telemetry bed.

## 2020-05-10 NOTE — ED Notes (Signed)
Pt was advised that urine sample is needed - unable to provide at this time

## 2020-05-10 NOTE — ED Notes (Signed)
Report called to Lydia, RN.

## 2020-05-10 NOTE — ED Notes (Addendum)
Pt got up at 5 am -  At work today ~ 7am was dizzy - passed out- denies hitting head, vomited, and was incontinent - urine and stool Denies pain

## 2020-05-10 NOTE — ED Notes (Signed)
Patient up to bathroom at this time. Patient offered wheelchair, patient adamantly refused wheelchair and assistance to bathroom. Patients wife accompanied patient to restroom.

## 2020-05-10 NOTE — H&P (Signed)
History and Physical    Anand Tejada VOH:[id number] DOB: 05/14/1955 DOA: 05/10/2020  PCP: Ralene Ok, MD  Patient coming from: Home  I have personally briefly reviewed patient's old medical records in [facility]  Chief Complaint: Syncope  HPI: Nathaniel Hart is a 65 y.o. male with medical history significant of HTN, looks like CKD 3 with creat 1.4 at baseline as of Nov 2021.  Pt presents to ED at [facility] after syncope episode at work.  Felt lightheaded prior to sitting down.  Pt sat down, had LOC, turned blue, had bowel incontinence.  Then regained consciousness.  Unresponsive for 3-5 mins.  No CP, no headache, no prior syncope.   ED Course: WBC 16k, Creat 1.49.  D.Dimer 0.83, Trop neg.  EKG with Q waves in lead III and TWI in III and AVF.  The TWI in III and AVF is old, the Q waves in lead 3 are new however.  Transferred for syncope workup.   Review of Systems: As per HPI, otherwise all review of systems negative.  Past Medical History:  Diagnosis Date  . Hypertension     No past surgical history on file.   reports that he has never smoked. He has never used smokeless tobacco. He reports that he does not drink alcohol and does not use drugs.  No Known Allergies  Family History  Problem Relation Age of Onset  . Hypertension Mother   . Diabetes Sister      Prior to Admission medications   Medication Sig Start Date End Date Taking? Authorizing Provider  HYDROcodone-acetaminophen (NORCO/VICODIN) 5-325 MG tablet Take 1 tablet by mouth every 4 (four) hours as needed. 05/03/20  Yes [provider]  amLODipine (NORVASC) 5 MG tablet Take 1 tablet (5 mg total) by mouth daily. 11/03/13   Monica Becton, MD  cyclobenzaprine (FLEXERIL) 10 MG tablet One half tab PO qHS, then increase gradually to one tab TID. 03/17/13   Monica Becton, MD  diazepam (VALIUM) 5 MG tablet 1-2 tablets every 8 hours as needed for vertigo/dizziness. 02/07/13    Monica Becton, MD  meclizine (ANTIVERT) 25 MG tablet Take 1 tablet (25 mg total) by mouth 3 (three) times daily as needed for dizziness or nausea. 03/10/13   Monica Becton, MD  meloxicam (MOBIC) 15 MG tablet One tab daily as needed for pain. 06/28/14   Monica Becton, MD  Vitamin D, Ergocalciferol, (DRISDOL) 50000 UNITS CAPS Take 1 capsule (50,000 Units total) by mouth every 7 (seven) days. 07/12/12   Monica Becton, MD    Physical Exam: Vitals:   05/10/20 1700 05/10/20 1900 05/10/20 2000 05/10/20 2229  BP: 133/86 140/79 (!) 144/83 (!) 152/84  Pulse: 81 73 79 78  Resp: 17 18 19 18   Temp:    98 F (36.7 C)  TempSrc:    Oral  SpO2: 97% 97% 95% 96%  Weight:    84 kg  Height:        Constitutional: NAD, calm, comfortable Eyes: PERRL, lids and conjunctivae normal ENMT: Mucous membranes are moist. Posterior pharynx clear of any exudate or lesions.Normal dentition.  Neck: normal, supple, no masses, no thyromegaly Respiratory: clear to auscultation bilaterally, no wheezing, no crackles. Normal respiratory effort. No accessory muscle use.  Cardiovascular: Regular rate and rhythm, no murmurs / rubs / gallops. No extremity edema. 2+ pedal pulses. No carotid bruits.  Abdomen: no tenderness, no masses palpated. No hepatosplenomegaly. Bowel sounds positive.  Musculoskeletal: no clubbing /  cyanosis. No joint deformity upper and lower extremities. Good ROM, no contractures. Normal muscle tone.  Skin: no rashes, lesions, ulcers. No induration Neurologic: CN 2-12 grossly intact. Sensation intact, DTR normal. Strength 5/5 in all 4.  Psychiatric: Normal judgment and insight. Alert and oriented x 3. Normal mood.    Labs on Admission: I have personally reviewed following labs and imaging studies  CBC: Recent Labs  Lab 05/10/20 0930  WBC 16.1*  NEUTROABS 13.1*  HGB 14.5  HCT 42.2  MCV 90.6  PLT 312   Basic Metabolic Panel: Recent Labs  Lab 05/10/20 0930  NA  136  K 3.6  CL 101  CO2 25  GLUCOSE 153*  BUN 29*  CREATININE 1.49*  CALCIUM 8.7*   GFR: Estimated Creatinine Clearance: 54 mL/min (A) (by C-G formula based on SCr of 1.49 mg/dL (H)). Liver Function Tests: No results for input(s): AST, ALT, ALKPHOS, BILITOT, PROT, ALBUMIN in the last 168 hours. No results for input(s): LIPASE, AMYLASE in the last 168 hours. No results for input(s): AMMONIA in the last 168 hours. Coagulation Profile: No results for input(s): INR, PROTIME in the last 168 hours. Cardiac Enzymes: No results for input(s): CKTOTAL, CKMB, CKMBINDEX, TROPONINI in the last 168 hours. BNP (last 3 results) No results for input(s): PROBNP in the last 8760 hours. HbA1C: No results for input(s): HGBA1C in the last 72 hours. CBG: Recent Labs  Lab 05/10/20 0904  GLUCAP 147*   Lipid Profile: No results for input(s): CHOL, HDL, LDLCALC, TRIG, CHOLHDL, LDLDIRECT in the last 72 hours. Thyroid Function Tests: No results for input(s): TSH, T4TOTAL, FREET4, T3FREE, THYROIDAB in the last 72 hours. Anemia Panel: No results for input(s): VITAMINB12, FOLATE, FERRITIN, TIBC, IRON, RETICCTPCT in the last 72 hours. Urine analysis:    Component Value Date/Time   COLORURINE YELLOW 05/10/2020 1130   APPEARANCEUR CLEAR 05/10/2020 1130   LABSPEC 1.025 05/10/2020 1130   PHURINE 5.5 05/10/2020 1130   GLUCOSEU NEGATIVE 05/10/2020 1130   HGBUR NEGATIVE 05/10/2020 1130   BILIRUBINUR NEGATIVE 05/10/2020 1130   KETONESUR NEGATIVE 05/10/2020 1130   PROTEINUR NEGATIVE 05/10/2020 1130   NITRITE NEGATIVE 05/10/2020 1130   LEUKOCYTESUR NEGATIVE 05/10/2020 1130    Radiological Exams on Admission: DG Chest Port 1 View  Result Date: 05/10/2020 CLINICAL DATA:  Elevated white count, dizzy, syncopal episode, vomiting EXAM: PORTABLE CHEST 1 VIEW COMPARISON:  None. FINDINGS: The heart size and mediastinal contours are within normal limits. Both lungs are clear. The visualized skeletal structures are  unremarkable. IMPRESSION: No active disease. Electronically Signed   By: Judie Petit.  Shick M.D.   On: 05/10/2020 10:09    EKG: Independently reviewed.  Assessment/Plan Principal Problem:   Syncope and collapse Active Problems:   Essential hypertension, benign   CKD (chronic kidney disease) stage 3, GFR 30-59 ml/min (HCC)    1. Syncope and collapse - 1. Syncope pathway 2. Tele monitor 3. 2d echo 4. EKG changes with new Q waves in lead III, old TWI in III and AVF, positive D.dimer: 1. Will put on empiric lovenox for the moment 2. And order VQ scan for AM 2. CKD 3 - 1. appears to be baseline creat of 1.4 based on Nov labs 3. HTN - 1. Med rec pending 2. Holding home BP meds for the moment in setting of syncope today.  DVT prophylaxis: Lovenox SQ Code Status: Full Family Communication: No family in room Disposition Plan: Home after syncope work up [employer] called: None Admission status: Place in [place]  Gia Lusher Judie Petit DO [facility]  How to contact the [facility] Attending or Consulting provider 7A - 7P or covering provider during after hours 7P -7A, for this patient?  1. Check the care team in [facility] and look for a) attending/consulting [facility] provider listed and b) the [facility] team listed 2. Log into [email or web address]  Amion Physician Scheduling and messaging for groups and whole hospitals  On call and physician scheduling software for group practices, residents, hospitalists and other medical providers for call, clinic, rotation and shift schedules. OnCall [employer] is a hospital-wide system for scheduling doctors and paging doctors on call. EasyPlot is for scientific plotting and data analysis.  [email or web address]  and use [facility]'s universal password to access. If you do not have the password, please contact the hospital operator.  3. Locate the [facility] provider you are looking for under [facility] and page to a number that you can be directly reached. 4. If you still have difficulty  reaching the provider, please page the [facility] (Director on Call) for the Hospitalists listed on amion for assistance.  05/10/2020, 11:52 PM

## 2020-05-10 NOTE — ED Notes (Addendum)
Spoke with lab to for D-dimer  OK with MD [place] - no repeat Trop

## 2020-05-10 NOTE — Progress Notes (Signed)
ANTICOAGULATION CONSULT NOTE - Initial Consult  Pharmacy Consult for Lovenox Indication: r/o pulmonary embolus  No Known Allergies  Patient Measurements: Height: 5\' 11"  (180.3 cm) Weight: 84 kg (185 lb 1.6 oz) IBW/kg (Calculated) : 75.3  Vital Signs: Temp: 98 F (36.7 C) (04/01 2229) Temp Source: Oral (04/01 2229) BP: 152/84 (04/01 2229) Pulse Rate: 78 (04/01 2229)  Labs: Recent Labs    05/10/20 0930  HGB 14.5  HCT 42.2  PLT 312  CREATININE 1.49*  TROPONINIHS 4    Estimated Creatinine Clearance: 54 mL/min (A) (by C-G formula based on SCr of 1.49 mg/dL (H)).   Medical History: Past Medical History:  Diagnosis Date  . Hypertension     Medications:  Awaiting electronic med rec  Assessment: 65 y.o. M presents after syncopal episodes. D-dimer 0.84. CT scanner down so unable to get CT at this time. To begin Lovenox for r/o PE. CBC stable on admission. No AC PTA.  Goal of Therapy:  Anti-Xa level 0.6-1 units/ml 4hrs after LMWH dose given Monitor platelets by anticoagulation protocol: Yes   Plan:  D/c Lovenox 40mg  - none given Lovenox 85mg  SQ q12h CBC q72h while on Lovenox  65, PharmD, BCPS Please see amion for complete clinical pharmacist phone list 05/10/2020,10:41 PM

## 2020-05-10 NOTE — ED Notes (Signed)
Patient given meatloaf and mashed potatoes at this time.

## 2020-05-10 NOTE — ED Provider Notes (Signed)
[facility] EMERGENCY DEPARTMENT Provider Note   CSN: [id number] Arrival date & time: 05/10/20  0848     History Chief Complaint  Patient presents with  . Near Syncope    Nathaniel Hart is a 65 y.o. male.  Patient presented to the ER chief complaint of a syncopal episode.  He states that he was at work today when he suddenly became unresponsive per coworkers.  Patient does not recall what happened, however his coworker stated that he started to appear dizzy and that they had him sit down.  He subsequently became unresponsive and turned blue.  He lost control of his bowel and bladder.  He was unresponsive for 3 to 5 minutes before regaining consciousness.  Patient has little recollection of events.  However at this time he says he feels fine.  No reports of recent vomiting or diarrhea or fever or cough.  This morning he ate his typical breakfast of chocolate milk and a slice of bread before heading to work.  He denies any prior episodes of syncope.  Denies preceding headache or chest pains.        Past Medical History:  Diagnosis Date  . Hypertension     Patient Active Problem List   Diagnosis Date Noted  . Radiculitis of left cervical region 03/17/2013  . Positional vertigo 02/07/2013  . Prediabetes 07/14/2012  . Hyperlipidemia 07/12/2012  . Essential hypertension, benign 07/05/2012  . Preventive measure 07/05/2012  . Onychomycosis of toenail 07/05/2012    No past surgical history on file.     Family History  Problem Relation Age of Onset  . Hypertension Mother   . Diabetes Sister     Social History   Tobacco Use  . Smoking status: Never Smoker  . Smokeless tobacco: Never Used  Substance Use Topics  . Alcohol use: No  . Drug use: No    Home Medications Prior to Admission medications   Medication Sig Start Date End Date Taking? Authorizing Provider  HYDROcodone-acetaminophen (NORCO/VICODIN) 5-325 MG tablet Take 1 tablet by mouth every 4  (four) hours as needed. 05/03/20  Yes [provider]  amLODipine (NORVASC) 5 MG tablet Take 1 tablet (5 mg total) by mouth daily. 11/03/13   Monica Becton, MD  cyclobenzaprine (FLEXERIL) 10 MG tablet One half tab PO qHS, then increase gradually to one tab TID. 03/17/13   Monica Becton, MD  diazepam (VALIUM) 5 MG tablet 1-2 tablets every 8 hours as needed for vertigo/dizziness. 02/07/13   Monica Becton, MD  meclizine (ANTIVERT) 25 MG tablet Take 1 tablet (25 mg total) by mouth 3 (three) times daily as needed for dizziness or nausea. 03/10/13   Monica Becton, MD  meloxicam (MOBIC) 15 MG tablet One tab daily as needed for pain. 06/28/14   Monica Becton, MD  Vitamin D, Ergocalciferol, (DRISDOL) 50000 UNITS CAPS Take 1 capsule (50,000 Units total) by mouth every 7 (seven) days. 07/12/12   Monica Becton, MD    Allergies    Patient has no known allergies.  Review of Systems   Review of Systems  Constitutional: Negative for fever.  HENT: Negative for ear pain and sore throat.   Eyes: Negative for pain.  Respiratory: Negative for cough.   Cardiovascular: Negative for chest pain.  Gastrointestinal: Negative for abdominal pain.  Genitourinary: Negative for flank pain.  Musculoskeletal: Negative for back pain.  Skin: Negative for color change and rash.  Neurological: Negative for syncope.  All other  systems reviewed and are negative.   Physical Exam Updated Vital Signs BP 123/67   Pulse 74   Temp (!) 97.4 F (36.3 C) (Oral)   Resp 14   Ht 5\' 11"  (1.803 m)   Wt 87.1 kg   SpO2 96%   BMI 26.78 kg/m   Physical Exam Constitutional:      General: He is not in acute distress.    Appearance: He is well-developed.  HENT:     Head: Normocephalic.     Nose: Nose normal.  Eyes:     Extraocular Movements: Extraocular movements intact.  Cardiovascular:     Rate and Rhythm: Normal rate.  Pulmonary:     Effort: Pulmonary effort is  normal.  Skin:    Coloration: Skin is not jaundiced.  Neurological:     Mental Status: He is alert. Mental status is at baseline.     ED Results / Procedures / Treatments   Labs (all labs ordered are listed, but only abnormal results are displayed) Labs Reviewed  CBC WITH DIFFERENTIAL/PLATELET - Abnormal; Notable for the following components:      Result Value   WBC 16.1 (*)    Neutro Abs 13.1 (*)    Abs Immature Granulocytes 0.08 (*)    All other components within normal limits  BASIC METABOLIC PANEL - Abnormal; Notable for the following components:   Glucose, Bld 153 (*)    BUN 29 (*)    Creatinine, Ser 1.49 (*)    Calcium 8.7 (*)    GFR, Estimated 52 (*)    All other components within normal limits  CBG MONITORING, ED - Abnormal; Notable for the following components:   Glucose-Capillary 147 (*)    All other components within normal limits  RESP PANEL BY RT-PCR (FLU A&B, COVID) ARPGX2  BRAIN NATRIURETIC PEPTIDE  URINALYSIS, ROUTINE W REFLEX MICROSCOPIC  TROPONIN I (HIGH SENSITIVITY)  TROPONIN I (HIGH SENSITIVITY)    EKG EKG Interpretation  Date/Time:  Friday May 10 2020 08:58:48 EDT Ventricular Rate:  69 PR Interval:  166 QRS Duration: 98 QT Interval:  394 QTC Calculation: 423 R Axis:   43 Text Interpretation: Sinus rhythm Borderline T abnormalities, inferior leads Confirmed by [id number] ([id number]) on 05/10/2020 10:04:12 AM   Radiology DG Chest Port 1 View  Result Date: 05/10/2020 CLINICAL DATA:  Elevated white count, dizzy, syncopal episode, vomiting EXAM: PORTABLE CHEST 1 VIEW COMPARISON:  None. FINDINGS: The heart size and mediastinal contours are within normal limits. Both lungs are clear. The visualized skeletal structures are unremarkable. IMPRESSION: No active disease. Electronically Signed   By: 07/10/2020.  Shick M.D.   On: 05/10/2020 10:09    Procedures Procedures   Medications Ordered in ED Medications - No data to display  ED Course  I have reviewed the  triage vital signs and the nursing notes.  Pertinent labs & imaging results that were available during my care of the patient were reviewed by me and considered in my medical decision making (see chart for details).    MDM Rules/Calculators/A&P                          Vital signs on arrival are within normal limits mildly bradycardic at about a heart rate of 60 bpm at rest.  EKG unremarkable sinus rhythm no ST elevations or depressions noted, normal QTC.  Patient denies suddenly standing up or sudden postural change to have brought on the syncopal episode.  I  am concerned for possible cardiogenic etiology.  Will be brought into the hospitalist team for further observation.  Final Clinical Impression(s) / ED Diagnoses Final diagnoses:  Syncope and collapse    Rx / DC Orders ED Discharge Orders    None       Cheryll Cockayne, MD 05/10/20 1050

## 2020-05-10 NOTE — ED Notes (Addendum)
Pt lined and labs sent Pt & Spouse - Flo were advised of the ED process and timeframes Pt has warm blankets x2, call bell, TV remote and advised NPO status Lights turned down for comfort

## 2020-05-11 ENCOUNTER — Observation Stay (HOSPITAL_COMMUNITY): Payer: BC Managed Care – PPO

## 2020-05-11 ENCOUNTER — Encounter (HOSPITAL_COMMUNITY): Payer: Self-pay | Admitting: Internal Medicine

## 2020-05-11 ENCOUNTER — Observation Stay (HOSPITAL_BASED_OUTPATIENT_CLINIC_OR_DEPARTMENT_OTHER): Payer: BC Managed Care – PPO

## 2020-05-11 DIAGNOSIS — N1831 Chronic kidney disease, stage 3a: Secondary | ICD-10-CM | POA: Diagnosis not present

## 2020-05-11 DIAGNOSIS — R55 Syncope and collapse: Secondary | ICD-10-CM

## 2020-05-11 DIAGNOSIS — R7989 Other specified abnormal findings of blood chemistry: Secondary | ICD-10-CM

## 2020-05-11 DIAGNOSIS — I10 Essential (primary) hypertension: Secondary | ICD-10-CM | POA: Diagnosis not present

## 2020-05-11 LAB — ECHOCARDIOGRAM COMPLETE
AR max vel: 3.15 cm2
AV Area VTI: 3.35 cm2
AV Area mean vel: 2.61 cm2
AV Mean grad: 4 mmHg
AV Peak grad: 7.2 mmHg
Ao pk vel: 1.34 m/s
Area-P 1/2: 2.16 cm2
Height: 71 in
MV VTI: 3.56 cm2
S' Lateral: 2.9 cm
Weight: 2974.4 oz

## 2020-05-11 LAB — COMPREHENSIVE METABOLIC PANEL
ALT: 27 U/L (ref 0–44)
AST: 23 U/L (ref 15–41)
Albumin: 3.4 g/dL — ABNORMAL LOW (ref 3.5–5.0)
Alkaline Phosphatase: 43 U/L (ref 38–126)
Anion gap: 7 (ref 5–15)
BUN: 24 mg/dL — ABNORMAL HIGH (ref 8–23)
CO2: 27 mmol/L (ref 22–32)
Calcium: 8.5 mg/dL — ABNORMAL LOW (ref 8.9–10.3)
Chloride: 101 mmol/L (ref 98–111)
Creatinine, Ser: 1.46 mg/dL — ABNORMAL HIGH (ref 0.61–1.24)
GFR, Estimated: 54 mL/min — ABNORMAL LOW (ref >60)
Glucose, Bld: 116 mg/dL — ABNORMAL HIGH (ref 70–99)
Potassium: 3.7 mmol/L (ref 3.5–5.1)
Sodium: 135 mmol/L (ref 135–145)
Total Bilirubin: 0.7 mg/dL (ref 0.3–1.2)
Total Protein: 6.3 g/dL — ABNORMAL LOW (ref 6.5–8.1)

## 2020-05-11 LAB — MAGNESIUM: Magnesium: 2.4 mg/dL (ref 1.7–2.4)

## 2020-05-11 LAB — CBC
HCT: 40.6 % (ref 39.0–52.0)
Hemoglobin: 13.8 g/dL (ref 13.0–17.0)
MCH: 31.1 pg (ref 26.0–34.0)
MCHC: 34 g/dL (ref 30.0–36.0)
MCV: 91.4 fL (ref 80.0–100.0)
Platelets: 298 10*3/uL (ref 150–400)
RBC: 4.44 MIL/uL (ref 4.22–5.81)
RDW: 15.3 % (ref 11.5–15.5)
WBC: 9.9 10*3/uL (ref 4.0–10.5)
nRBC: 0 % (ref 0.0–0.2)

## 2020-05-11 LAB — GLUCOSE, CAPILLARY
Glucose-Capillary: 117 mg/dL — ABNORMAL HIGH (ref 70–99)
Glucose-Capillary: 138 mg/dL — ABNORMAL HIGH (ref 70–99)

## 2020-05-11 LAB — PROTIME-INR
INR: 1.1 (ref 0.8–1.2)
Prothrombin Time: 13.3 seconds (ref 11.4–15.2)

## 2020-05-11 LAB — PHOSPHORUS: Phosphorus: 3.8 mg/dL (ref 2.5–4.6)

## 2020-05-11 LAB — HIV ANTIBODY (ROUTINE TESTING W REFLEX): HIV Screen 4th Generation wRfx: NONREACTIVE

## 2020-05-11 LAB — APTT: aPTT: 33 seconds (ref 24–36)

## 2020-05-11 IMAGING — DX DG CHEST 1V PORT
1 series · 1 of 1 positions shown · non-contrast
Comparison: None.

CLINICAL DATA: Elevated D-dimer

EXAM:
PORTABLE CHEST 1 VIEW

[chest]
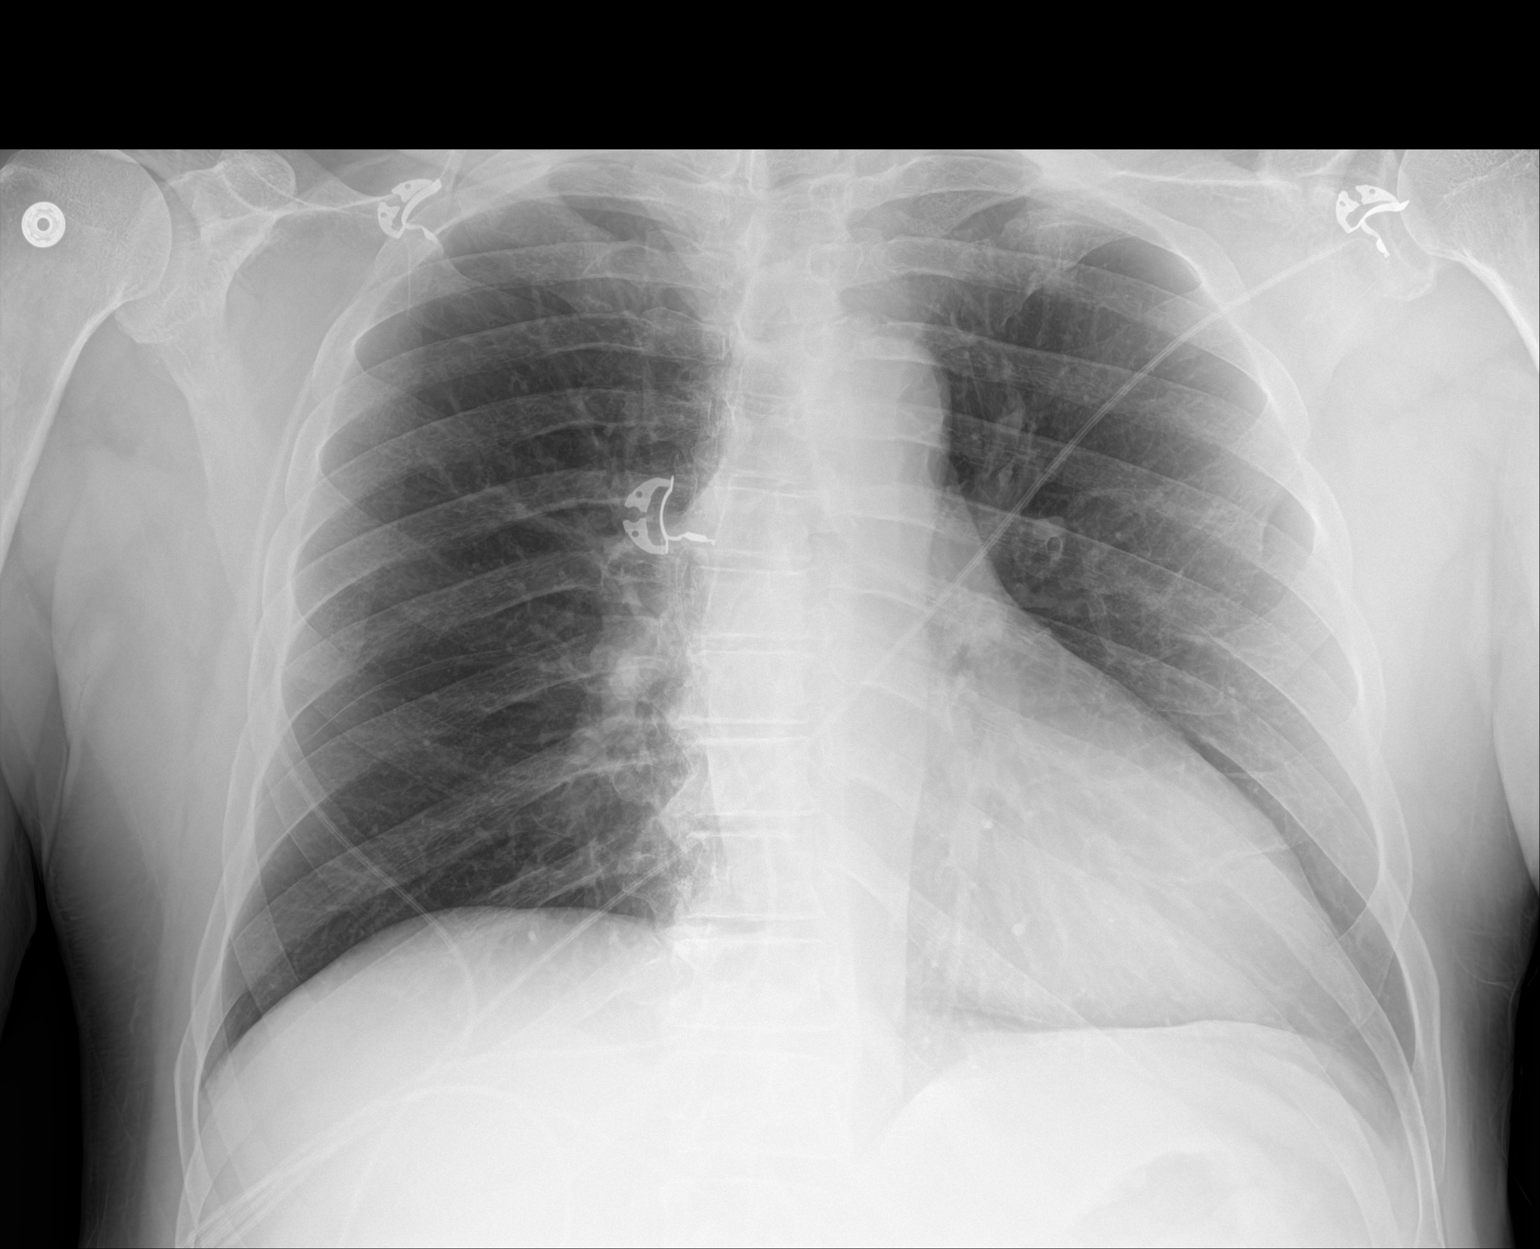

[1 of 1 positions shown; findings below may reference images not displayed]

FINDINGS: Normal mediastinum and cardiac silhouette. Normal pulmonary
vasculature. No evidence of effusion, infiltrate, or pneumothorax.
No acute bony abnormality.
IMPRESSION: No acute cardiopulmonary process.

## 2020-05-11 MED ORDER — TECHNETIUM TO 99M ALBUMIN AGGREGATED: 4.2000 | Freq: Once | INTRAVENOUS | Status: DC | PRN

## 2020-05-11 NOTE — Progress Notes (Signed)
Pt transported to Nuclear Medicine  

## 2020-05-11 NOTE — Progress Notes (Signed)
VASCULAR LAB    Bilateral lower extremity venous duplex has been performed.  See CV proc for preliminary results.   Kaimana Neuzil, RVT 05/11/2020, 9:39 AM

## 2020-05-11 NOTE — Discharge Summary (Addendum)
Physician Discharge Summary  Nathaniel Hart OEV:[id number] DOB: [birth year] DOA: 05/10/2020  PCP: Ralene Ok, MD  Admit date: 05/10/2020 Discharge date: 05/11/2020  Time spent: 45 minutes  Recommendations for Outpatient Follow-up:  Patient will be discharged to home.  Patient will need to follow up with primary care provider within one week of discharge.  Patient should continue medications as prescribed.  Patient should follow a heart healthy diet.   Discharge Diagnoses:  Syncope and collapse Elevated D-dimer Leukocytosis Essential hypertension Chronic kidney disease, stage IIIA  Discharge Condition: stable  Diet recommendation: heart healthy  Filed Weights   05/10/20 0906 05/10/20 2229 05/11/20 0342  Weight: 87.1 kg 84 kg 84.3 kg    History of present illness:  On 05/10/2020 by Dr. Lyda Perone Nathaniel Hart is a 65 y.o. male with medical history significant of HTN, looks like CKD 3 with creat 1.4 at baseline as of Nov 2021.  Pt presents to ED at [facility] after syncope episode at work.  Felt lightheaded prior to sitting down.  Pt sat down, had LOC, turned blue, had bowel incontinence.  Then regained consciousness.  Unresponsive for 3-5 mins.  No CP, no headache, no prior syncope.  Hospital Course:  Syncope and collapse -Patient had syncopal episode while at work, followed by loss of consciousness as well as bowel incontinence.  However patient did regain consciousness. -Patient states that he did not take his blood pressure medications that morning however did take some pain medication -Suspect syncope was due to orthostasis although patient's orthostatic vitals have been unremarkable -Echocardiogram EF of 60 to 65%, grade 1 diastolic dysfunction.  Moderate LVH.  No regional wall motion abnormalities. -EEG normal waking and sleep study.  No clear focal or left form abnormalities-results relayed to patient -patient instructed not to drive until seen by PCP  Elevated  D-dimer -VQ scan unremarkable for PE -Lower extremity Doppler unremarkable for DVT  Leukocytosis -Suspect reactive, has not resolved -Patient afebrile -Chest x-ray and UA unremarkable for infection  Essential hypertension -Continue Micardis, HCTZ held   Chronic kidney disease, stage IIIA -Creatinine appears to be stable  Procedures: Echocardiogram LE doppler EEG VQ scan  Consultations: None  Discharge Exam: Vitals:   05/11/20 1149 05/11/20 1541  BP: (!) 145/79 (!) 155/86  Pulse: 67 67  Resp: 17 17  Temp: 98.5 F (36.9 C) 98.3 F (36.8 C)  SpO2: 97% 98%     General: Well developed, well nourished, NAD, appears stated age  HEENT: NCAT, mucous membranes moist.  Cardiovascular: S1 S2 auscultated, RRR  Respiratory: Clear to auscultation bilaterally with equal chest rise  Abdomen: Soft, nontender, nondistended, + bowel sounds  Extremities: warm dry without cyanosis clubbing or edema  Neuro: AAOx3, nonfocal  Psych: Normal affect and demeanor with intact judgement and insight  Discharge Instructions Discharge Instructions    Discharge instructions   Complete by: As directed    Patient will be discharged to home.  Patient will need to follow up with primary care provider within one week of discharge.  Patient should continue medications as prescribed.  Patient should follow a heart healthy diet.   Increase activity slowly   Complete by: As directed      Allergies as of 05/11/2020   No Known Allergies     Medication List    STOP taking these medications   amLODipine 5 MG tablet Commonly known as: NORVASC   cyclobenzaprine 10 MG tablet Commonly known as: FLEXERIL   diazepam 5 MG tablet Commonly  known as: Valium   hydrochlorothiazide 25 MG tablet Commonly known as: HYDRODIURIL   meclizine 25 MG tablet Commonly known as: ANTIVERT   meloxicam 15 MG tablet Commonly known as: MOBIC   predniSONE 10 MG tablet Commonly known as: DELTASONE   Vitamin  D (Ergocalciferol) 1.25 MG (50000 UNIT) Caps capsule Commonly known as: DRISDOL     TAKE these medications   HYDROcodone-acetaminophen 5-325 MG tablet Commonly known as: NORCO/VICODIN Take 1 tablet by mouth every 4 (four) hours as needed.   telmisartan 80 MG tablet Commonly known as: MICARDIS Take 80 mg by mouth daily.      No Known Allergies  Follow-up Information    Ralene OkMoreira, Roy, MD. Schedule an appointment as soon as possible for a visit in 1 week(s).   Specialty: Internal Medicine Why: Hospital follow up Contact information: [address] [facility], [place] [id number]                The results of significant diagnostics from this hospitalization (including imaging, microbiology, ancillary and laboratory) are listed below for reference.    Significant Diagnostic Studies: NM Pulmonary Perfusion  Result Date: 05/11/2020 CLINICAL DATA:  PE suspected.  Positive D-dimer. EXAM: NUCLEAR MEDICINE PERFUSION LUNG SCAN TECHNIQUE: Perfusion images were obtained in multiple projections after intravenous injection of radiopharmaceutical. Ventilation scans intentionally deferred if perfusion scan and chest x-ray adequate for interpretation during COVID 19 epidemic. RADIOPHARMACEUTICALS:  4.3 mCi Tc-4256m MAA IV COMPARISON:  Chest x-ray May 11, 2020 FINDINGS: No segmental defects are identified in the lungs. IMPRESSION: Normal perfusion scan.  No evidence of pulmonary embolus. Electronically Signed   By: Gerome Samavid  Williams III M.D   On: 05/11/2020 15:43   DG CHEST PORT 1 VIEW  Result Date: 05/11/2020 CLINICAL DATA:  Elevated D-dimer EXAM: PORTABLE CHEST 1 VIEW COMPARISON:  None. FINDINGS: Normal mediastinum and cardiac silhouette. Normal pulmonary vasculature. No evidence of effusion, infiltrate, or pneumothorax. No acute bony abnormality. IMPRESSION: No acute cardiopulmonary process. Electronically Signed   By: Genevive BiStewart  Edmunds M.D.   On: 05/11/2020 14:47   DG Chest Port 1  View  Result Date: 05/10/2020 CLINICAL DATA:  Elevated white count, dizzy, syncopal episode, vomiting EXAM: PORTABLE CHEST 1 VIEW COMPARISON:  None. FINDINGS: The heart size and mediastinal contours are within normal limits. Both lungs are clear. The visualized skeletal structures are unremarkable. IMPRESSION: No active disease. Electronically Signed   By: Judie PetitM.  Shick M.D.   On: 05/10/2020 10:09   ECHOCARDIOGRAM COMPLETE  Result Date: 05/11/2020    ECHOCARDIOGRAM REPORT   [facility]   [facility] Date of Exam: 05/11/2020 Medical Rec #:  [id number]         Height:       71.0 in Accession #:    [id number]        Weight:       185.9 lb Date of Birth:  January 23, 1956        BSA:          2.044 m Patient Age:    64 years          BP:           138/78 mmHg Patient Gender: M                 HR:           70 bpm. Exam Location:  Inpatient Procedure: 2D Echo, Cardiac Doppler and Color Doppler Indications:    Syncope  History:  Patient has no prior history of Echocardiogram examinations.                 Risk Factors:Hypertension and Dyslipidemia.  Sonographer:    Ross Ludwig RDCS (AE) Referring Phys: [phone] JARED M GARDNER IMPRESSIONS  1. Left ventricular ejection fraction, by estimation, is 60 to 65%. The left ventricle has normal function. The left ventricle has no regional wall motion abnormalities. There is moderate left ventricular hypertrophy. Left ventricular diastolic parameters are consistent with Grade I diastolic dysfunction (impaired relaxation).  2. Right ventricular systolic function is normal. The right ventricular size is normal.  3. The mitral valve is normal in structure. No evidence of mitral valve regurgitation. No evidence of mitral stenosis.  4. The aortic valve is normal in structure. Aortic valve regurgitation is not visualized. No aortic stenosis is present.  5. The inferior vena cava is normal in size with greater than 50% respiratory variability, suggesting right atrial pressure of 3 mmHg.  FINDINGS  Left Ventricle: Left ventricular ejection fraction, by estimation, is 60 to 65%. The left ventricle has normal function. The left ventricle has no regional wall motion abnormalities. The left ventricular internal cavity size was normal in size. There is  moderate left ventricular hypertrophy. Left ventricular diastolic parameters are consistent with Grade I diastolic dysfunction (impaired relaxation). Right Ventricle: The right ventricular size is normal. No increase in right ventricular wall thickness. Right ventricular systolic function is normal. Left Atrium: Left atrial size was normal in size. Right Atrium: Right atrial size was normal in size. Pericardium: There is no evidence of pericardial effusion. Mitral Valve: The mitral valve is normal in structure. There is mild thickening of the mitral valve leaflet(s). No evidence of mitral valve regurgitation. No evidence of mitral valve stenosis. MV peak gradient, 1.9 mmHg. The mean mitral valve gradient is  1.0 mmHg. Tricuspid Valve: The tricuspid valve is normal in structure. Tricuspid valve regurgitation is not demonstrated. No evidence of tricuspid stenosis. Aortic Valve: The aortic valve is normal in structure. Aortic valve regurgitation is not visualized. No aortic stenosis is present. Aortic valve mean gradient measures 4.0 mmHg. Aortic valve peak gradient measures 7.2 mmHg. Aortic valve area, by VTI measures 3.35 cm. Pulmonic Valve: The pulmonic valve was normal in structure. Pulmonic valve regurgitation is not visualized. No evidence of pulmonic stenosis. Aorta: The aortic root is normal in size and structure. Venous: The inferior vena cava is normal in size with greater than 50% respiratory variability, suggesting right atrial pressure of 3 mmHg. IAS/Shunts: No atrial level shunt detected by color flow Doppler.  LEFT VENTRICLE PLAX 2D LVIDd:         4.00 cm  Diastology LVIDs:         2.90 cm  LV e' medial:    7.40 cm/s LV PW:         1.70 cm  LV  E/e' medial:  7.7 LV IVS:        1.20 cm  LV e' lateral:   8.16 cm/s LVOT diam:     2.20 cm  LV E/e' lateral: 7.0 LV SV:         79 LV SV Index:   39 LVOT Area:     3.80 cm  RIGHT VENTRICLE             IVC RV Basal diam:  2.60 cm     IVC diam: 1.10 cm RV S prime:     13.80 cm/s TAPSE (M-mode): 1.8 cm LEFT  ATRIUM             Index       RIGHT ATRIUM           Index LA diam:        3.10 cm 1.52 cm/m  RA Area:     11.30 cm LA Vol (A2C):   47.8 ml 23.38 ml/m RA Volume:   20.30 ml  9.93 ml/m LA Vol (A4C):   37.9 ml 18.54 ml/m LA Biplane Vol: 45.7 ml 22.36 ml/m  AORTIC VALVE AV Area (Vmax):    3.15 cm AV Area (Vmean):   2.61 cm AV Area (VTI):     3.35 cm AV Vmax:           134.00 cm/s AV Vmean:          91.200 cm/s AV VTI:            0.236 m AV Peak Grad:      7.2 mmHg AV Mean Grad:      4.0 mmHg LVOT Vmax:         111.00 cm/s LVOT Vmean:        62.500 cm/s LVOT VTI:          0.208 m LVOT/AV VTI ratio: 0.88  AORTA Ao Root diam: 3.60 cm MITRAL VALVE MV Area (PHT): 2.16 cm    SHUNTS MV Area VTI:   3.56 cm    Systemic VTI:  0.21 m MV Peak grad:  1.9 mmHg    Systemic Diam: 2.20 cm MV Mean grad:  1.0 mmHg MV Vmax:       0.69 m/s MV Vmean:      42.0 cm/s MV Decel Time: 352 msec MV E velocity: 57.00 cm/s MV A velocity: 57.40 cm/s MV E/A ratio:  0.99 Donato Schultz MD Electronically signed by Donato Schultz MD Signature Date/Time: 05/11/2020/1:02:18 PM    Final    VAS [place] LOWER EXTREMITY VENOUS (DVT)  Result Date: 05/11/2020  Lower Venous DVT Study Indications: Syncope, elevated D-Dimer.  Comparison Study: No prior study Performing Technologist: Sherren Kerns RVS  Examination Guidelines: A complete evaluation includes B-mode imaging, spectral Doppler, color Doppler, and power Doppler as needed of all accessible portions of each vessel. Bilateral testing is considered an integral part of a complete examination. Limited examinations for reoccurring indications may be performed as noted. The reflux portion of the exam is  performed with the patient in reverse Trendelenburg.  +---------+---------------+---------+-----------+----------+--------------+ RIGHT    CompressibilityPhasicitySpontaneityPropertiesThrombus Aging +---------+---------------+---------+-----------+----------+--------------+ CFV      Full                    Yes                                 +---------+---------------+---------+-----------+----------+--------------+ SFJ      Full                                                        +---------+---------------+---------+-----------+----------+--------------+ FV Prox  Full                                                        +---------+---------------+---------+-----------+----------+--------------+  FV Mid   Full                                                        +---------+---------------+---------+-----------+----------+--------------+ FV DistalFull                                                        +---------+---------------+---------+-----------+----------+--------------+ PFV      Full                                                        +---------+---------------+---------+-----------+----------+--------------+ POP      Full           Yes      Yes                                 +---------+---------------+---------+-----------+----------+--------------+ PTV      Full                                                        +---------+---------------+---------+-----------+----------+--------------+ PERO     Full                                                        +---------+---------------+---------+-----------+----------+--------------+   +---------+---------------+---------+-----------+----------+--------------+ LEFT     CompressibilityPhasicitySpontaneityPropertiesThrombus Aging +---------+---------------+---------+-----------+----------+--------------+ CFV      Full           Yes      Yes                                  +---------+---------------+---------+-----------+----------+--------------+ SFJ      Full                                                        +---------+---------------+---------+-----------+----------+--------------+ FV Prox  Full                                                        +---------+---------------+---------+-----------+----------+--------------+ FV Mid   Full                                                        +---------+---------------+---------+-----------+----------+--------------+  FV DistalFull                                                        +---------+---------------+---------+-----------+----------+--------------+ PFV      Full                                                        +---------+---------------+---------+-----------+----------+--------------+ POP      Full           Yes      Yes                                 +---------+---------------+---------+-----------+----------+--------------+ PTV      Full                                                        +---------+---------------+---------+-----------+----------+--------------+ PERO     Full                                                        +---------+---------------+---------+-----------+----------+--------------+     Summary: BILATERAL: - No evidence of deep vein thrombosis seen in the lower extremities, bilaterally. -   *See table(s) above for measurements and observations. Electronically signed by Sherald Hess MD on 05/11/2020 at 11:13:28 AM.    Final     Microbiology: Recent Results (from the past 240 hour(s))  Resp Panel by RT-PCR (Flu A&B, Covid) Nasopharyngeal Swab     Status: None   Collection Time: 05/10/20  9:30 AM   Specimen: Nasopharyngeal Swab; Nasopharyngeal(NP) swabs in vial transport medium  Result Value Ref Range Status   SARS Coronavirus 2 by RT PCR NEGATIVE NEGATIVE Final    Comment: (NOTE) SARS-CoV-2 target nucleic  acids are NOT DETECTED.  The SARS-CoV-2 RNA is generally detectable in upper respiratory specimens during the acute phase of infection. The lowest concentration of SARS-CoV-2 viral copies this assay can detect is 138 copies/mL. A negative result does not preclude SARS-Cov-2 infection and should not be used as the sole basis for treatment or other patient management decisions. A negative result may occur with  improper specimen collection/handling, submission of specimen other than nasopharyngeal swab, presence of viral mutation(s) within the areas targeted by this assay, and inadequate number of viral copies(<138 copies/mL). A negative result must be combined with clinical observations, patient history, and epidemiological information. The expected result is Negative.  Fact Sheet for Patients:  BloggerCourse.com  Fact Sheet for Healthcare Providers:  SeriousBroker.it  This test is no t yet approved or cleared by the [place] FDA and  has been authorized for detection and/or diagnosis of SARS-CoV-2 by FDA under an Emergency Use Authorization (EUA). This EUA will remain  in effect (meaning this test can be used) for the duration of the  COVID-19 declaration under Section 564(b)(1) of the Act, 21 U.S.C.section 360bbb-3(b)(1), unless the authorization is terminated  or revoked sooner.       Influenza A by PCR NEGATIVE NEGATIVE Final   Influenza B by PCR NEGATIVE NEGATIVE Final    Comment: (NOTE) The Xpert Xpress SARS-CoV-2/FLU/RSV plus assay is intended as an aid in the diagnosis of influenza from Nasopharyngeal swab specimens and should not be used as a sole basis for treatment. Nasal washings and aspirates are unacceptable for Xpert Xpress SARS-CoV-2/FLU/RSV testing.  Fact Sheet for Patients: BloggerCourse.com  Fact Sheet for Healthcare Providers: SeriousBroker.it  This  test is not yet approved or cleared by the [place] FDA and has been authorized for detection and/or diagnosis of SARS-CoV-2 by FDA under an Emergency Use Authorization (EUA). This EUA will remain in effect (meaning this test can be used) for the duration of the COVID-19 declaration under Section 564(b)(1) of the Act, 21 U.S.C. section 360bbb-3(b)(1), unless the authorization is terminated or revoked.  Performed at [facility], [address] [place], [place] [address]      Labs: Basic Metabolic Panel: Recent Labs  Lab 05/10/20 0930 05/11/20 0229  NA 136 135  K 3.6 3.7  CL 101 101  CO2 25 27  GLUCOSE 153* 116*  BUN 29* 24*  CREATININE 1.49* 1.46*  CALCIUM 8.7* 8.5*  MG  --  2.4  PHOS  --  3.8   Liver Function Tests: Recent Labs  Lab 05/11/20 0229  AST 23  ALT 27  ALKPHOS 43  BILITOT 0.7  PROT 6.3*  ALBUMIN 3.4*   No results for input(s): LIPASE, AMYLASE in the last 168 hours. No results for input(s): AMMONIA in the last 168 hours. CBC: Recent Labs  Lab 05/10/20 0930 05/11/20 0229  WBC 16.1* 9.9  NEUTROABS 13.1*  --   HGB 14.5 13.8  HCT 42.2 40.6  MCV 90.6 91.4  PLT 312 298   Cardiac Enzymes: No results for input(s): CKTOTAL, CKMB, CKMBINDEX, TROPONINI in the last 168 hours. BNP: BNP (last 3 results) Recent Labs    05/10/20 0930  BNP 9.3    ProBNP (last 3 results) No results for input(s): PROBNP in the last 8760 hours.  CBG: Recent Labs  Lab 05/10/20 0904 05/11/20 0558 05/11/20 0732  GLUCAP 147* 117* 138*       Signed:  Gene Glazebrook  [facility] 05/11/2020, 5:46 PM

## 2020-05-11 NOTE — Progress Notes (Signed)
EEG complete - results pending 

## 2020-05-11 NOTE — Progress Notes (Incomplete)
Pt's IV removed, catheter intact. Tele removed, ccmd aware. Pt has all belongings and understands d/c instructions. Pt d/c

## 2020-05-11 NOTE — Procedures (Signed)
EEG Report Indication: possible seizure activity  This study was recorded in the waking and sleep state.  The duration of the study was 32 minutes.  Electrodes were placed according to the International 10/20 system.  Video was reviewed/available for clinical correlation as needed.  In the waking state, clear background organization is seen with a distinct anterior - posterior voltage and frequency gradient and a symmetric and reactive posterior dominant rhythm of approximately 10 hertz.  Anteriorly, is the expected pattern of faster frequency, lower voltage waveforms.  During the drowsy state, there is attenuation of the gradient, a general shift to slower frequencies diffusely, a waxing and waning of the posterior dominant rhythm, and slow roving eye movements.  During sleep, there were well formed and symmetric sleep transients.   Hyperventilation: Deferred Photic stimulation: Unremarkable  There are no clear focal, paroxysmal or epileptiform abnormalities  or interhemispheric asymmetries.  Impression:  This is a normal waking and sleep study.  There are no clear focal or epileptiform abnormalities.

## 2020-05-11 NOTE — Discharge Instructions (Signed)
Syncope Syncope is when you pass out (faint) for a short time. It is caused by a sudden decrease in blood flow to the brain. Signs that you may be about to pass out include:  Feeling dizzy or light-headed.  Feeling sick to your stomach (nauseous).  Seeing all white or all black.  Having cold, clammy skin. If you pass out, get help right away. Call your local emergency services (911 in the [place] Do not drive yourself to the hospital. Follow these instructions at home: Watch for any changes in your symptoms. Take these actions to stay safe and help with your symptoms: Lifestyle  Do not drive, use machinery, or play sports until your doctor says it is okay.  Do not drink alcohol.  Do not use any products that contain nicotine or tobacco, such as cigarettes and e-cigarettes. If you need help quitting, ask your doctor.  Drink enough fluid to keep your pee (urine) pale yellow. General instructions  Take over-the-counter and prescription medicines only as told by your doctor.  If you are taking blood pressure or heart medicine, sit up and stand up slowly. Spend a few minutes getting ready to sit and then stand. This can help you feel less dizzy.  Have someone stay with you until you feel stable.  If you start to feel like you might pass out, lie down right away and raise (elevate) your feet above the level of your heart. Breathe deeply and steadily. Wait until all of the symptoms are gone.  Keep all follow-up visits as told by your doctor. This is important. Get help right away if:  You have a very bad headache.  You pass out once or more than once.  You have pain in your chest, belly, or back.  You have a very fast or uneven heartbeat (palpitations).  It hurts to breathe.  You are bleeding from your mouth or your bottom (rectum).  You have black or tarry poop (stool).  You have jerky movements that you cannot control (seizure).  You are confused.  You have trouble  walking.  You are very weak.  You have vision problems. These symptoms may be an emergency. Do not wait to see if the symptoms will go away. Get medical help right away. Call your local emergency services (911 in the [place] Do not drive yourself to the hospital. Summary  Syncope is when you pass out (faint) for a short time. It is caused by a sudden decrease in blood flow to the brain.  Signs that you may be about to faint include feeling dizzy, light-headed, or sick to your stomach, seeing all white or all black, or having cold, clammy skin.  If you start to feel like you might pass out, lie down right away and raise (elevate) your feet above the level of your heart. Breathe deeply and steadily. Wait until all of the symptoms are gone. This information is not intended to replace advice given to you by your health care provider. Make sure you discuss any questions you have with your health care provider. Document Revised: 03/08/2019 Document Reviewed: 03/10/2017 Elsevier Patient Education  2021 [employer].  

## 2020-05-11 NOTE — Progress Notes (Signed)
  Echocardiogram 2D Echocardiogram has been performed.  Nathaniel Hart 05/11/2020, 8:47 AM

## 2020-05-20 ENCOUNTER — Other Ambulatory Visit: Payer: Self-pay | Admitting: Internal Medicine

## 2020-05-20 DIAGNOSIS — R55 Syncope and collapse: Secondary | ICD-10-CM

## 2020-06-01 ENCOUNTER — Ambulatory Visit
Admission: RE | Admit: 2020-06-01 | Discharge: 2020-06-01 | Disposition: A | Discharge status: Home / Self Care | Payer: BC Managed Care – PPO | Source: Ambulatory Visit | Attending: Internal Medicine | Admitting: Internal Medicine

## 2020-06-01 DIAGNOSIS — R55 Syncope and collapse: Secondary | ICD-10-CM
